# Patient Record
Sex: Male | Born: 1960 | Race: White | Hispanic: No | Marital: Married | Smoking: Former smoker
Health system: Southern US, Community
[De-identification: ages and names within clinical notes are randomized; demographics above are authoritative.]

## PROBLEM LIST (undated history)

## (undated) DIAGNOSIS — I10 Essential (primary) hypertension: Secondary | ICD-10-CM

## (undated) DIAGNOSIS — K219 Gastro-esophageal reflux disease without esophagitis: Secondary | ICD-10-CM

---

## 2008-06-05 ENCOUNTER — Ambulatory Visit: Payer: Self-pay | Admitting: Internal Medicine

## 2008-09-05 ENCOUNTER — Emergency Department: Payer: Self-pay | Admitting: Internal Medicine

## 2008-09-06 ENCOUNTER — Emergency Department: Payer: Self-pay | Admitting: Emergency Medicine

## 2008-09-07 ENCOUNTER — Emergency Department: Payer: Self-pay | Admitting: Emergency Medicine

## 2009-04-29 ENCOUNTER — Ambulatory Visit: Payer: Self-pay | Admitting: Internal Medicine

## 2009-05-01 ENCOUNTER — Ambulatory Visit: Payer: Self-pay | Admitting: Internal Medicine

## 2009-05-03 ENCOUNTER — Ambulatory Visit: Payer: Self-pay | Admitting: Internal Medicine

## 2009-05-06 ENCOUNTER — Ambulatory Visit: Payer: Self-pay | Admitting: Internal Medicine

## 2009-07-15 ENCOUNTER — Ambulatory Visit: Payer: Self-pay | Admitting: Family Medicine

## 2009-07-16 ENCOUNTER — Ambulatory Visit: Payer: Self-pay | Admitting: Internal Medicine

## 2009-07-18 ENCOUNTER — Ambulatory Visit: Payer: Self-pay | Admitting: Internal Medicine

## 2009-07-20 ENCOUNTER — Ambulatory Visit: Payer: Self-pay | Admitting: Internal Medicine

## 2009-07-22 ENCOUNTER — Ambulatory Visit: Payer: Self-pay | Admitting: Internal Medicine

## 2009-07-24 ENCOUNTER — Ambulatory Visit: Payer: Self-pay | Admitting: Family Medicine

## 2009-07-26 ENCOUNTER — Ambulatory Visit: Payer: Self-pay | Admitting: Internal Medicine

## 2009-09-25 ENCOUNTER — Ambulatory Visit: Payer: Self-pay | Admitting: Family Medicine

## 2009-10-22 ENCOUNTER — Ambulatory Visit: Payer: Self-pay | Admitting: Internal Medicine

## 2009-10-25 ENCOUNTER — Ambulatory Visit: Payer: Self-pay | Admitting: Family Medicine

## 2010-10-09 DIAGNOSIS — F1010 Alcohol abuse, uncomplicated: Secondary | ICD-10-CM | POA: Insufficient documentation

## 2010-10-09 DIAGNOSIS — Q631 Lobulated, fused and horseshoe kidney: Secondary | ICD-10-CM | POA: Insufficient documentation

## 2010-10-09 DIAGNOSIS — Z87891 Personal history of nicotine dependence: Secondary | ICD-10-CM | POA: Insufficient documentation

## 2012-05-19 ENCOUNTER — Encounter (HOSPITAL_COMMUNITY): Payer: Self-pay | Admitting: Emergency Medicine

## 2012-05-19 ENCOUNTER — Emergency Department (HOSPITAL_COMMUNITY)
Admission: EM | Admit: 2012-05-19 | Discharge: 2012-05-19 | Disposition: A | Discharge status: Home / Self Care | Payer: Self-pay | Attending: Emergency Medicine | Admitting: Emergency Medicine

## 2012-05-19 DIAGNOSIS — W57XXXA Bitten or stung by nonvenomous insect and other nonvenomous arthropods, initial encounter: Secondary | ICD-10-CM | POA: Insufficient documentation

## 2012-05-19 DIAGNOSIS — Y939 Activity, unspecified: Secondary | ICD-10-CM | POA: Insufficient documentation

## 2012-05-19 DIAGNOSIS — S30860A Insect bite (nonvenomous) of lower back and pelvis, initial encounter: Secondary | ICD-10-CM | POA: Insufficient documentation

## 2012-05-19 DIAGNOSIS — R21 Rash and other nonspecific skin eruption: Secondary | ICD-10-CM | POA: Insufficient documentation

## 2012-05-19 DIAGNOSIS — Y929 Unspecified place or not applicable: Secondary | ICD-10-CM | POA: Insufficient documentation

## 2012-05-19 MED ORDER — SULFAMETHOXAZOLE-TRIMETHOPRIM 800-160 MG PO TABS: 1.0000 | ORAL_TABLET | Freq: Two times a day (BID) | ORAL | Status: DC

## 2012-05-19 MED ORDER — PREDNISONE 10 MG PO TABS: ORAL_TABLET | ORAL | Status: DC

## 2012-05-19 NOTE — ED Notes (Signed)
Pt c/o rash to the left side around the breast area x one week.

## 2012-05-19 NOTE — ED Provider Notes (Signed)
History     CSN: [id number]  Arrival date & time 05/19/12  3086   First MD Initiated Contact with Patient 05/19/12 1909      Chief Complaint  Patient presents with  . Rash  . Pruritis    (Consider location/radiation/quality/duration/timing/severity/associated sxs/prior treatment) Patient is a 52 y.o. male presenting with rash. The history is provided by the patient.  Rash Location:  Torso Torso rash location:  L chest and L flank Quality: itchiness, redness and swelling   Quality: not blistering, not bruising, not burning, not draining, not dry, not painful, not scaling and not weeping   Severity:  Mild Onset quality:  Gradual Duration:  1 week Timing:  Constant Progression:  Unchanged Chronicity:  New Context: insect bite/sting   Context: not chemical exposure, not new detergent/soap, not plant contact, not sick contacts and not sun exposure   Relieved by:  Nothing Worsened by:  Heat Ineffective treatments:  Anti-itch cream Associated symptoms: no abdominal pain, no diarrhea, no fever, no headaches, no induration, no joint pain, no myalgias, no nausea, no shortness of breath, no sore throat, no throat swelling, no tongue swelling, no URI, not vomiting and not wheezing     History reviewed. No pertinent past medical history.  History reviewed. No pertinent past surgical history.  History reviewed. No pertinent family history.  History  Substance Use Topics  . Smoking status: Never Smoker   . Smokeless tobacco: Not on file  . Alcohol Use: Yes      Review of Systems  Constitutional: Negative for fever, chills, activity change and appetite change.  HENT: Negative for sore throat, facial swelling, trouble swallowing, neck pain and neck stiffness.   Respiratory: Negative for chest tightness, shortness of breath and wheezing.   Gastrointestinal: Negative for nausea, vomiting, abdominal pain and diarrhea.  Musculoskeletal: Negative for myalgias and arthralgias.   Skin: Positive for rash. Negative for wound.  Neurological: Negative for dizziness, weakness, numbness and headaches.  All other systems reviewed and are negative.    Allergies  Review of patient's allergies indicates not on file.  Home Medications  No current outpatient prescriptions on file.  BP 150/69  Pulse 73  Temp(Src) 97.8 F (36.6 C) (Oral)  Resp 20  Ht 6\' 2"  (1.88 m)  Wt 190 lb (86.183 kg)  BMI 24.38 kg/m2  SpO2 100%  Physical Exam  Nursing note and vitals reviewed. Constitutional: He is oriented to person, place, and time. He appears well-developed and well-nourished. No distress.  HENT:  Head: Normocephalic and atraumatic.  Mouth/Throat: Oropharynx is clear and moist.  Neck: Normal range of motion. Neck supple.  Cardiovascular: Normal rate, regular rhythm, normal heart sounds and intact distal pulses.   No murmur heard. Pulmonary/Chest: Effort normal and breath sounds normal.  Musculoskeletal: He exhibits no edema and no tenderness.  Lymphadenopathy:    He has no cervical adenopathy.  Neurological: He is alert and oriented to person, place, and time. He exhibits normal muscle tone. Coordination normal.  Skin: Skin is warm and dry. Rash noted. No purpura noted. Rash is papular and maculopapular. Rash is not pustular, not vesicular and not urticarial. There is erythema.  Erythematous slightly raised area to the left lateral chest with two adjacent smaller lesions that appear similar. No induration, fluctuance, or excessive warmth.  No vesicles.      ED Course  Procedures (including critical care time)  Labs Reviewed - No data to display No results found.      MDM  Patient  is alert, non-toxic appearing.  Vitals reviewed and considered.  4 cm erythematous slightly raised area to the left lateral chest with two adjacent, smaller lesions.  They appear c/w insect bites.  Will treat with prednisone taper and cover for potential secondary infection with  bactrim DS .  He also agrees to take OTC benadryl for itching   Pt agrees to return here in 1-2 days if the sx's worsen.  Appears stable for discharge.    Kameran Mcneese L. Linzie Criss, PA-C 05/20/12 0045

## 2012-05-21 NOTE — ED Provider Notes (Signed)
Medical screening examination/treatment/procedure(s) were performed by non-physician practitioner and as supervising physician I was immediately available for consultation/collaboration.   Benny Lennert, MD 05/21/12 [phone]

## 2012-10-07 ENCOUNTER — Emergency Department: Payer: Self-pay | Admitting: Internal Medicine

## 2013-02-04 ENCOUNTER — Ambulatory Visit: Payer: Self-pay | Admitting: Internal Medicine

## 2014-12-24 ENCOUNTER — Encounter: Payer: Self-pay | Admitting: Emergency Medicine

## 2014-12-24 ENCOUNTER — Ambulatory Visit
Admission: EM | Admit: 2014-12-24 | Discharge: 2014-12-24 | Disposition: A | Discharge status: Home / Self Care | Payer: Managed Care, Other (non HMO) | Attending: Family Medicine | Admitting: Family Medicine

## 2014-12-24 DIAGNOSIS — R509 Fever, unspecified: Secondary | ICD-10-CM

## 2014-12-24 DIAGNOSIS — M791 Myalgia, unspecified site: Secondary | ICD-10-CM

## 2014-12-24 DIAGNOSIS — R109 Unspecified abdominal pain: Secondary | ICD-10-CM | POA: Diagnosis not present

## 2014-12-24 HISTORY — DX: Essential (primary) hypertension: I10

## 2014-12-24 LAB — URINALYSIS COMPLETE WITH MICROSCOPIC (ARMC ONLY)
Bacteria, UA: NONE SEEN
Bilirubin Urine: NEGATIVE
Glucose, UA: NEGATIVE mg/dL
Leukocytes, UA: NEGATIVE
NITRITE: NEGATIVE
PH: 7 (ref 5.0–8.0)
PROTEIN: NEGATIVE mg/dL
SPECIFIC GRAVITY, URINE: 1.015 (ref 1.005–1.030)
WBC UA: NONE SEEN WBC/hpf (ref 0–5)

## 2014-12-24 LAB — CBC WITH DIFFERENTIAL/PLATELET
BASOS ABS: 0.1 10*3/uL (ref 0–0.1)
Basophils Relative: 1 %
EOS ABS: 0 10*3/uL (ref 0–0.7)
EOS PCT: 0 %
HCT: 44.7 % (ref 40.0–52.0)
Hemoglobin: 15.8 g/dL (ref 13.0–18.0)
Lymphocytes Relative: 15 %
Lymphs Abs: 0.7 10*3/uL — ABNORMAL LOW (ref 1.0–3.6)
MCH: 33.3 pg (ref 26.0–34.0)
MCHC: 35.4 g/dL (ref 32.0–36.0)
MCV: 93.9 fL (ref 80.0–100.0)
MONO ABS: 0.5 10*3/uL (ref 0.2–1.0)
Monocytes Relative: 11 %
Neutro Abs: 3.7 10*3/uL (ref 1.4–6.5)
Neutrophils Relative %: 73 %
PLATELETS: 213 10*3/uL (ref 150–440)
RBC: 4.76 MIL/uL (ref 4.40–5.90)
RDW: 12.6 % (ref 11.5–14.5)
WBC: 5.1 10*3/uL (ref 3.8–10.6)

## 2014-12-24 LAB — BASIC METABOLIC PANEL
ANION GAP: 11 (ref 5–15)
BUN: 13 mg/dL (ref 6–20)
CALCIUM: 9.3 mg/dL (ref 8.9–10.3)
CO2: 25 mmol/L (ref 22–32)
Chloride: 98 mmol/L — ABNORMAL LOW (ref 101–111)
Creatinine, Ser: 1.02 mg/dL (ref 0.61–1.24)
GFR calc Af Amer: >60 mL/min (ref >60)
Glucose, Bld: 111 mg/dL — ABNORMAL HIGH (ref 65–99)
POTASSIUM: 4 mmol/L (ref 3.5–5.1)
SODIUM: 134 mmol/L — AB (ref 135–145)

## 2014-12-24 LAB — RAPID INFLUENZA A&B ANTIGENS (ARMC ONLY): INFLUENZA A (ARMC): NOT DETECTED

## 2014-12-24 LAB — RAPID INFLUENZA A&B ANTIGENS: Influenza B (ARMC): NOT DETECTED

## 2014-12-24 MED ORDER — CIPROFLOXACIN HCL 500 MG PO TABS: 500.0000 mg | ORAL_TABLET | Freq: Two times a day (BID) | ORAL | Status: DC

## 2014-12-24 NOTE — ED Notes (Signed)
Pt reports bilat lower back pain, low grade fever and chills. Denies blood in urine. Cough.

## 2014-12-24 NOTE — ED Provider Notes (Addendum)
Patient presents today with symptoms of low-grade fever, myalgias, bilateral flank pain for the last day. He denies any upper respiratory symptoms. He denies any abdominal pain, nausea, vomiting, diarrhea, chest pain, shortness of breath. He denies having a cough to me even though stated to the nurse. He has had a right upper molar that has been bothering him but is not bothering him today. He is seeing his dentist tomorrow. He does admit to having multiple tick bites in the past but currently is not dealing with any rash or joint pain and swelling. He denies having a diagnosis of Lyme's disease are recommended spotted fever. He denies any penile discharge or dysuria. He does have urinary frequency but states that that is normal for him. He often times has to hold his urine due to not being able to urinate when needed at work. Denies any history of prostatitis in the past.  ROS: Negative except mentioned above.  GENERAL: NAD HEENT: no pharyngeal erythema, no exudate, no erythema of TMs, no cervical LAD, no tooth abscess or facial swelling noted  RESP: CTA B, no wheezing, rhonci, or accessory muscle use  CARD: RRR ABD: +BS, NT/ND, no rebound or guarding, mild bilateral flank tenderness, mildly enlarged boggy prostate NEURO: CN II-XII grossly intact   A/P: Subjective Fever, Myalgias, Flank Discomfort- Flu screen is negative, CBC essentially normal, urine analysis and BMP were also done, urine sent for culture, will treat patient with Cipro for possible prostatitis, Tylenol/Motrin prn, I've asked the patient to seek medical attention if his symptoms change or worsen in any way. Lyme and RMSF titers were drawn. He states that he will be seeing his dentist tomorrow. Follow up with primary care physician this week if possible.   Jolene ProvostKirtida Aviv Rota, MD 12/24/14 1231  Jolene ProvostKirtida Tylar Amborn, MD 12/24/14 [phone]

## 2014-12-26 LAB — ROCKY MTN SPOTTED FVR ABS PNL(IGG+IGM)
RMSF IgG: POSITIVE — AB
RMSF IgM: 0.24 index (ref 0.00–0.89)

## 2014-12-26 LAB — URINE CULTURE: SPECIAL REQUESTS: NORMAL

## 2014-12-26 LAB — B. BURGDORFI ANTIBODIES: B burgdorferi Ab IgG+IgM: <0.91 {ISR} (ref 0.00–0.90)

## 2014-12-26 LAB — RMSF, IGG, IFA

## 2015-01-04 ENCOUNTER — Telehealth: Payer: Self-pay | Admitting: Emergency Medicine

## 2015-01-04 MED ORDER — DOXYCYCLINE HYCLATE 100 MG PO TABS: 100.0000 mg | ORAL_TABLET | Freq: Two times a day (BID) | ORAL | Status: DC

## 2015-01-04 NOTE — ED Notes (Signed)
Patient was notified that his RMSF test was Positive and that Dr. Judd Gaudieronty sent in a prescription for Doxycycline for him to take.  Patient was instructed to take the Doxycycline as directed and to follow-up with his PCP if symptoms do not improve or worsen.  Patient verbalized understanding.

## 2017-08-07 ENCOUNTER — Ambulatory Visit
Admission: EM | Admit: 2017-08-07 | Discharge: 2017-08-07 | Disposition: A | Discharge status: Home / Self Care | Payer: Managed Care, Other (non HMO) | Attending: Family Medicine | Admitting: Family Medicine

## 2017-08-07 ENCOUNTER — Encounter: Payer: Self-pay | Admitting: Emergency Medicine

## 2017-08-07 ENCOUNTER — Other Ambulatory Visit: Payer: Self-pay

## 2017-08-07 DIAGNOSIS — R42 Dizziness and giddiness: Secondary | ICD-10-CM

## 2017-08-07 DIAGNOSIS — R6883 Chills (without fever): Secondary | ICD-10-CM | POA: Diagnosis not present

## 2017-08-07 DIAGNOSIS — M791 Myalgia, unspecified site: Secondary | ICD-10-CM | POA: Diagnosis not present

## 2017-08-07 LAB — CBC WITH DIFFERENTIAL/PLATELET
BASOS PCT: 1 %
Basophils Absolute: 0.1 10*3/uL (ref 0–0.1)
Eosinophils Absolute: 0.3 10*3/uL (ref 0–0.7)
Eosinophils Relative: 5 %
HEMATOCRIT: 40.9 % (ref 40.0–52.0)
Hemoglobin: 14.2 g/dL (ref 13.0–18.0)
LYMPHS ABS: 1.3 10*3/uL (ref 1.0–3.6)
LYMPHS PCT: 27 %
MCH: 33.4 pg (ref 26.0–34.0)
MCHC: 34.7 g/dL (ref 32.0–36.0)
MCV: 96.2 fL (ref 80.0–100.0)
MONO ABS: 0.4 10*3/uL (ref 0.2–1.0)
MONOS PCT: 9 %
NEUTROS ABS: 2.9 10*3/uL (ref 1.4–6.5)
Neutrophils Relative %: 58 %
Platelets: 238 10*3/uL (ref 150–440)
RBC: 4.25 MIL/uL — ABNORMAL LOW (ref 4.40–5.90)
RDW: 12.6 % (ref 11.5–14.5)
WBC: 5 10*3/uL (ref 3.8–10.6)

## 2017-08-07 LAB — COMPREHENSIVE METABOLIC PANEL
ALBUMIN: 3.9 g/dL (ref 3.5–5.0)
ALK PHOS: 60 U/L (ref 38–126)
ALT: 20 U/L (ref 0–44)
ANION GAP: 10 (ref 5–15)
AST: 23 U/L (ref 15–41)
BILIRUBIN TOTAL: 0.5 mg/dL (ref 0.3–1.2)
BUN: 14 mg/dL (ref 6–20)
CALCIUM: 8.7 mg/dL — AB (ref 8.9–10.3)
CO2: 23 mmol/L (ref 22–32)
Chloride: 101 mmol/L (ref 98–111)
Creatinine, Ser: 0.87 mg/dL (ref 0.61–1.24)
GFR calc Af Amer: >60 mL/min (ref >60)
Glucose, Bld: 106 mg/dL — ABNORMAL HIGH (ref 70–99)
Potassium: 3.6 mmol/L (ref 3.5–5.1)
Sodium: 134 mmol/L — ABNORMAL LOW (ref 135–145)
Total Protein: 7.1 g/dL (ref 6.5–8.1)

## 2017-08-07 MED ORDER — MECLIZINE HCL 25 MG PO TABS: 25.0000 mg | ORAL_TABLET | Freq: Three times a day (TID) | ORAL | 0 refills | Status: AC | PRN

## 2017-08-07 NOTE — Discharge Instructions (Signed)
Use the medication as needed.  Rest.  Increase fluids.  No alcohol.   Take care  Dr. Adriana Simasook

## 2017-08-07 NOTE — ED Provider Notes (Signed)
[facility]    CSN: [id number] Arrival date & time: 08/07/17  1552  History   Chief Complaint Chief Complaint  Patient presents with  . Dizziness   HPI  57 year old male presents with dizziness.  Patient reports dizziness and lightheadedness since Wednesday.  Intermittent.  Severe at times.  Patient states that it started after he was drinking alcohol and was out in the hot sun.  Patient has difficulty describing his dizziness and lightheadedness.  He states that he has a sensation of motion.  He also states that he is "not as aware" when he is driving.  Patient reports body aches today.  He also reports chills.  No fever.  No respiratory symptoms.  Patient concerned about many potential culprits.  He mentioned tickborne illness as well as pneumonia.  Patient states that he is unsure of what the inciting factor is.  He insinuates that it may be the fact that he is been outside in the heat.  No known exacerbating or relieving factors.   PMH: HLD (hyperlipidemia)    Horseshoe kidney    GERD (gastroesophageal reflux disease)    Seasonal allergies    Alcohol abuse    Former smoker    Horseshoe kidney    Premature atrial contractions     Surgical Hx: Colonoscopy.  Home Medications    Prior to Admission medications   Medication Sig Start Date End Date Taking? Authorizing Provider  amlodipine-atorvastatin (CADUET) 10-10 MG tablet Take 1 tablet by mouth daily.   Yes [provider]  aspirin EC 81 MG tablet Take 81 mg by mouth daily.   Yes [provider]  hydrochlorothiazide (MICROZIDE) 12.5 MG capsule Take 12.5 mg by mouth daily.   Yes [provider]  omega-3 acid ethyl esters (LOVAZA) 1 g capsule Take by mouth 2 (two) times daily.   Yes [provider]  meclizine (ANTIVERT) 25 MG tablet Take 1 tablet (25 mg total) by mouth 3 (three) times daily as needed for dizziness. 08/07/17   Tommie Sams, DO   Family History Family  History  Problem Relation Age of Onset  . Hypertension Mother   . Diabetes Mother   . Hypertension Father    Social History Social History   Tobacco Use  . Smoking status: Former Games developer  . Smokeless tobacco: Never Used  Substance Use Topics  . Alcohol use: Yes  . Drug use: No    Allergies   Patient has no allergy information on record.  Review of Systems Review of Systems  Constitutional: Positive for chills. Negative for fever.  Musculoskeletal:       Body aches.  Skin: Negative for rash.  Neurological: Positive for dizziness and light-headedness.   Physical Exam Triage Vital Signs ED Triage Vitals  Enc Vitals Group     BP 08/07/17 1603 (!) 171/90     Pulse Rate 08/07/17 1603 65     Resp 08/07/17 1603 18     Temp 08/07/17 1603 97.6 F (36.4 C)     Temp Source 08/07/17 1603 Oral     SpO2 08/07/17 1603 100 %     Weight 08/07/17 1604 200 lb (90.7 kg)     Height 08/07/17 1604 6' (1.829 m)     Head Circumference --      Peak Flow --      Pain Score 08/07/17 1603 2     Pain Loc --      Pain Edu? --  Excl. in GC? --    Updated Vital Signs BP (!) 171/90 (BP Location: Left Arm)   Pulse 65   Temp 97.6 F (36.4 C) (Oral)   Resp 18   Ht 6' (1.829 m)   Wt 200 lb (90.7 kg)   SpO2 100%   BMI 27.12 kg/m   Physical Exam  Constitutional: He is oriented to person, place, and time. He appears well-developed. No distress.  HENT:  Head: Normocephalic and atraumatic.  Mouth/Throat: Oropharynx is clear and moist.  Eyes: Pupils are equal, round, and reactive to light. Conjunctivae are normal.  Cardiovascular: Normal rate and regular rhythm.  Pulmonary/Chest: Effort normal and breath sounds normal. He has no wheezes. He has no rales.  Abdominal: Soft. He exhibits no distension. There is no tenderness.  Neurological: He is alert and oriented to person, place, and time.  Psychiatric: He has a normal mood and affect. His behavior is normal.  Nursing note and vitals  reviewed.  UC Treatments / Results  Labs (all labs ordered are listed, but only abnormal results are displayed) Labs Reviewed  CBC WITH DIFFERENTIAL/PLATELET - Abnormal; Notable for the following components:      Result Value   RBC 4.25 (*)    All other components within normal limits  COMPREHENSIVE METABOLIC PANEL - Abnormal; Notable for the following components:   Sodium 134 (*)    Glucose, Bld 106 (*)    Calcium 8.7 (*)    All other components within normal limits    EKG None  Radiology No results found.  Procedures Procedures (including critical care time)  Medications Ordered in UC Medications - No data to display  Initial Impression / Assessment and Plan / UC Course  I have reviewed the triage vital signs and the nursing notes.  Pertinent labs & imaging results that were available during my care of the patient were reviewed by me and considered in my medical decision making (see chart for details).    57 year old male presents with dizziness and lightheadedness.  History is very vague.  He is well-appearing.  His exam is unrevealing.  Orthostatics negative.  Laboratory studies unremarkable.  Meclizine as needed.  Advised hydration.  Supportive care.  Final Clinical Impressions(s) / UC Diagnoses   Final diagnoses:  Dizziness     Discharge Instructions     Use the medication as needed.  Rest.  Increase fluids.  No alcohol.   Take care  Dr. Adriana Simasook     ED Prescriptions    Medication Sig Dispense Auth. Provider   meclizine (ANTIVERT) 25 MG tablet Take 1 tablet (25 mg total) by mouth 3 (three) times daily as needed for dizziness. 30 tablet Tommie Samsook, Malaka Ruffner G, DO     Controlled Substance Prescriptions [place] Controlled Substance Registry consulted? Not Applicable   Tommie SamsCook, Agastya Meister G, DO 08/07/17 1710

## 2017-08-07 NOTE — ED Triage Notes (Signed)
Patient states he has had off and on dizziness since Wednesday.  Has a sinus head ache and some nausea as well

## 2020-06-04 ENCOUNTER — Emergency Department: Payer: Managed Care, Other (non HMO)

## 2020-06-04 ENCOUNTER — Observation Stay
Admission: EM | Admit: 2020-06-04 | Discharge: 2020-06-05 | Disposition: A | Discharge status: Home / Self Care | Payer: Managed Care, Other (non HMO) | Attending: Obstetrics and Gynecology | Admitting: Obstetrics and Gynecology

## 2020-06-04 ENCOUNTER — Other Ambulatory Visit: Payer: Self-pay

## 2020-06-04 DIAGNOSIS — Z87891 Personal history of nicotine dependence: Secondary | ICD-10-CM | POA: Diagnosis not present

## 2020-06-04 DIAGNOSIS — I10 Essential (primary) hypertension: Secondary | ICD-10-CM | POA: Diagnosis not present

## 2020-06-04 DIAGNOSIS — Z20822 Contact with and (suspected) exposure to covid-19: Secondary | ICD-10-CM | POA: Insufficient documentation

## 2020-06-04 DIAGNOSIS — Z7982 Long term (current) use of aspirin: Secondary | ICD-10-CM | POA: Diagnosis not present

## 2020-06-04 DIAGNOSIS — I200 Unstable angina: Principal | ICD-10-CM | POA: Diagnosis present

## 2020-06-04 DIAGNOSIS — R079 Chest pain, unspecified: Secondary | ICD-10-CM | POA: Diagnosis present

## 2020-06-04 DIAGNOSIS — Z79899 Other long term (current) drug therapy: Secondary | ICD-10-CM | POA: Diagnosis not present

## 2020-06-04 HISTORY — DX: Gastro-esophageal reflux disease without esophagitis: K21.9

## 2020-06-04 LAB — CBC
HCT: 41.4 % (ref 39.0–52.0)
Hemoglobin: 14.9 g/dL (ref 13.0–17.0)
MCH: 33.5 pg (ref 26.0–34.0)
MCHC: 36 g/dL (ref 30.0–36.0)
MCV: 93 fL (ref 80.0–100.0)
Platelets: 254 10*3/uL (ref 150–400)
RBC: 4.45 MIL/uL (ref 4.22–5.81)
RDW: 12 % (ref 11.5–15.5)
WBC: 6.8 10*3/uL (ref 4.0–10.5)
nRBC: 0 % (ref 0.0–0.2)

## 2020-06-04 LAB — BASIC METABOLIC PANEL
Anion gap: 10 (ref 5–15)
BUN: 10 mg/dL (ref 6–20)
CO2: 25 mmol/L (ref 22–32)
Calcium: 9.3 mg/dL (ref 8.9–10.3)
Chloride: 100 mmol/L (ref 98–111)
Creatinine, Ser: 1.08 mg/dL (ref 0.61–1.24)
GFR, Estimated: >60 mL/min (ref >60)
Glucose, Bld: 130 mg/dL — ABNORMAL HIGH (ref 70–99)
Potassium: 3.8 mmol/L (ref 3.5–5.1)
Sodium: 135 mmol/L (ref 135–145)

## 2020-06-04 LAB — HIV ANTIBODY (ROUTINE TESTING W REFLEX): HIV Screen 4th Generation wRfx: NONREACTIVE

## 2020-06-04 LAB — PROTIME-INR
INR: 1 (ref 0.8–1.2)
Prothrombin Time: 13.5 seconds (ref 11.4–15.2)

## 2020-06-04 LAB — HEPARIN LEVEL (UNFRACTIONATED): Heparin Unfractionated: 0.34 IU/mL (ref 0.30–0.70)

## 2020-06-04 LAB — TROPONIN I (HIGH SENSITIVITY)
Troponin I (High Sensitivity): 7 ng/L (ref <18)
Troponin I (High Sensitivity): 7 ng/L (ref <18)

## 2020-06-04 MED ORDER — ASPIRIN EC 81 MG PO TBEC
81.0000 mg | DELAYED_RELEASE_TABLET | Freq: Every day | ORAL | Status: DC
  Administered 2020-06-05: 81 mg via ORAL
  Filled 2020-06-04: qty 1

## 2020-06-04 MED ORDER — AMLODIPINE-ATORVASTATIN 10-10 MG PO TABS: 1.0000 | ORAL_TABLET | Freq: Every day | ORAL | Status: DC

## 2020-06-04 MED ORDER — PANTOPRAZOLE SODIUM 20 MG PO TBEC
20.0000 mg | DELAYED_RELEASE_TABLET | Freq: Two times a day (BID) | ORAL | Status: DC
  Administered 2020-06-04 – 2020-06-05 (×2): 20 mg via ORAL
  Filled 2020-06-04 (×3): qty 1

## 2020-06-04 MED ORDER — ONDANSETRON HCL 4 MG/2ML IJ SOLN
4.0000 mg | Freq: Once | INTRAMUSCULAR | Status: AC
  Administered 2020-06-04: 4 mg via INTRAVENOUS
  Filled 2020-06-04: qty 2

## 2020-06-04 MED ORDER — HEPARIN BOLUS VIA INFUSION
4000.0000 [IU] | Freq: Once | INTRAVENOUS | Status: AC
  Administered 2020-06-04: 4000 [IU] via INTRAVENOUS
  Filled 2020-06-04: qty 4000

## 2020-06-04 MED ORDER — HYDROCHLOROTHIAZIDE 12.5 MG PO CAPS
12.5000 mg | ORAL_CAPSULE | Freq: Every day | ORAL | Status: DC
  Administered 2020-06-05: 12.5 mg via ORAL
  Filled 2020-06-04: qty 1

## 2020-06-04 MED ORDER — OMEGA-3-ACID ETHYL ESTERS 1 G PO CAPS
1.0000 g | ORAL_CAPSULE | Freq: Two times a day (BID) | ORAL | Status: DC
  Administered 2020-06-04 – 2020-06-05 (×2): 1 g via ORAL
  Filled 2020-06-04 (×3): qty 1

## 2020-06-04 MED ORDER — AMLODIPINE BESYLATE 10 MG PO TABS
10.0000 mg | ORAL_TABLET | Freq: Every day | ORAL | Status: DC
  Administered 2020-06-04: 10 mg via ORAL
  Filled 2020-06-04: qty 2

## 2020-06-04 MED ORDER — ACETAMINOPHEN 325 MG PO TABS
650.0000 mg | ORAL_TABLET | ORAL | Status: DC | PRN
  Administered 2020-06-04: 650 mg via ORAL
  Filled 2020-06-04: qty 2

## 2020-06-04 MED ORDER — HEPARIN (PORCINE) 25000 UT/250ML-% IV SOLN
1250.0000 [IU]/h | INTRAVENOUS | Status: DC
  Administered 2020-06-04 – 2020-06-05 (×2): 1250 [IU]/h via INTRAVENOUS
  Filled 2020-06-04 (×3): qty 250

## 2020-06-04 MED ORDER — NITROGLYCERIN 0.4 MG SL SUBL
0.4000 mg | SUBLINGUAL_TABLET | SUBLINGUAL | Status: DC | PRN
  Administered 2020-06-04: 0.4 mg via SUBLINGUAL
  Filled 2020-06-04: qty 1

## 2020-06-04 MED ORDER — FLUTICASONE PROPIONATE 50 MCG/ACT NA SUSP
1.0000 | Freq: Every day | NASAL | Status: DC
  Filled 2020-06-04: qty 16

## 2020-06-04 MED ORDER — MORPHINE SULFATE (PF) 4 MG/ML IV SOLN
4.0000 mg | INTRAVENOUS | Status: DC | PRN
Start: 2020-06-04 — End: 2020-06-06
  Administered 2020-06-04 – 2020-06-05 (×4): 4 mg via INTRAVENOUS
  Filled 2020-06-04 (×4): qty 1

## 2020-06-04 MED ORDER — METOPROLOL TARTRATE 25 MG PO TABS
25.0000 mg | ORAL_TABLET | Freq: Two times a day (BID) | ORAL | Status: DC
  Administered 2020-06-04 – 2020-06-05 (×3): 25 mg via ORAL
  Filled 2020-06-04 (×3): qty 1

## 2020-06-04 MED ORDER — ONDANSETRON HCL 4 MG/2ML IJ SOLN: 4.0000 mg | Freq: Four times a day (QID) | INTRAMUSCULAR | Status: DC | PRN

## 2020-06-04 MED ORDER — IOHEXOL 350 MG/ML SOLN
75.0000 mL | Freq: Once | INTRAVENOUS | Status: AC | PRN
  Administered 2020-06-04: 75 mL via INTRAVENOUS

## 2020-06-04 MED ORDER — ATORVASTATIN CALCIUM 10 MG PO TABS: 10.0000 mg | ORAL_TABLET | Freq: Every day | ORAL | Status: DC

## 2020-06-04 NOTE — ED Provider Notes (Signed)
[facility] Emergency Department Provider Note    Event Date/Time   First MD Initiated Contact with Patient 06/04/20 [phone]     (approximate)  I have reviewed the triage vital signs and the nursing notes.   HISTORY  Chief Complaint Chest Pain    HPI Ian Mcpherson is a 60 y.o. male   with below listed past medical history presents to the ER for evaluation of left-sided chest pain radiating through to his back.  States the pain is moderate to severe associate with some diaphoresis.  Never had pain like this before.  States that occurred while he was putting on clothes.  Denies any fevers.  Does have a history of hypertension and high cholesterol.  Does have family history of CAD.  Does have a remote history of smoking.  Denies any trauma.  No cough or congestion.     Past Medical History:  Diagnosis Date  . Hypertension    Family History  Problem Relation Age of Onset  . Hypertension Mother   . Diabetes Mother   . Hypertension Father    History reviewed. No pertinent surgical history. There are no problems to display for this patient.     Prior to Admission medications   Medication Sig Start Date End Date Taking? Authorizing Provider  amlodipine-atorvastatin (CADUET) 10-10 MG tablet Take 1 tablet by mouth daily.    [provider]  aspirin EC 81 MG tablet Take 81 mg by mouth daily.    [provider]  hydrochlorothiazide (MICROZIDE) 12.5 MG capsule Take 12.5 mg by mouth daily.    [provider]  meclizine (ANTIVERT) 25 MG tablet Take 1 tablet (25 mg total) by mouth 3 (three) times daily as needed for dizziness. 08/07/17   Tommie Sams, DO  omega-3 acid ethyl esters (LOVAZA) 1 g capsule Take by mouth 2 (two) times daily.    [provider]    Allergies Patient has no allergy information on record.    Social History Social History   Tobacco Use  . Smoking status: Former Games developer  . Smokeless tobacco:  Never Used  Vaping Use  . Vaping Use: Former  Substance Use Topics  . Alcohol use: Yes    Comment: beer everday 3-4 beers  . Drug use: No    Review of Systems Patient denies headaches, rhinorrhea, blurry vision, numbness, shortness of breath, chest pain, edema, cough, abdominal pain, nausea, vomiting, diarrhea, dysuria, fevers, rashes or hallucinations unless otherwise stated above in HPI. ____________________________________________   PHYSICAL EXAM:  VITAL SIGNS: Vitals:   06/04/20 0839  BP: (!) 172/92  Pulse: 71  Resp: 18  Temp: 98 F (36.7 C)  SpO2: 99%    Constitutional: Alert and oriented.  Eyes: Conjunctivae are normal.  Head: Atraumatic. Nose: No congestion/rhinnorhea. Mouth/Throat: Mucous membranes are moist.   Neck: No stridor. Painless ROM.  Cardiovascular: Normal rate, regular rhythm. Grossly normal heart sounds.  Good peripheral circulation. Respiratory: Normal respiratory effort.  No retractions. Lungs CTAB. Gastrointestinal: Soft and nontender. No distention. No abdominal bruits. No CVA tenderness. Genitourinary:  Musculoskeletal: No lower extremity tenderness nor edema.  No joint effusions. Neurologic:  Normal speech and language. No gross focal neurologic deficits are appreciated. No facial droop Skin:  Skin is warm, dry and intact. No rash noted. Psychiatric: Mood and affect are normal. Speech and behavior are normal.  ____________________________________________   LABS (all labs ordered are listed, but only abnormal results are displayed)  Results for orders  placed or performed during the hospital encounter of 06/04/20 (from the past 24 hour(s))  Basic metabolic panel     Status: Abnormal   Collection Time: 06/04/20  8:38 AM  Result Value Ref Range   Sodium 135 135 - 145 mmol/L   Potassium 3.8 3.5 - 5.1 mmol/L   Chloride 100 98 - 111 mmol/L   CO2 25 22 - 32 mmol/L   Glucose, Bld 130 (H) 70 - 99 mg/dL   BUN 10 6 - 20 mg/dL   Creatinine, Ser  5.62 0.61 - 1.24 mg/dL   Calcium 9.3 8.9 - 13.0 mg/dL   GFR, Estimated >86 >57 mL/min   Anion gap 10 5 - 15  CBC     Status: None   Collection Time: 06/04/20  8:38 AM  Result Value Ref Range   WBC 6.8 4.0 - 10.5 K/uL   RBC 4.45 4.22 - 5.81 MIL/uL   Hemoglobin 14.9 13.0 - 17.0 g/dL   HCT 84.6 96.2 - 95.2 %   MCV 93.0 80.0 - 100.0 fL   MCH 33.5 26.0 - 34.0 pg   MCHC 36.0 30.0 - 36.0 g/dL   RDW 84.1 32.4 - 40.1 %   Platelets 254 150 - 400 K/uL   nRBC 0.0 0.0 - 0.2 %  Troponin I (High Sensitivity)     Status: None   Collection Time: 06/04/20  8:38 AM  Result Value Ref Range   Troponin I (High Sensitivity) 7 <18 ng/L   ____________________________________________  EKG My review and personal interpretation at Time: 8:33   Indication: chest pain  Rate: 75  Rhythm: sinus Axis: normal Other: normal intervals, no stemi ____________________________________________  RADIOLOGY  I personally reviewed all radiographic images ordered to evaluate for the above acute complaints and reviewed radiology reports and findings.  These findings were personally discussed with the patient.  Please see medical record for radiology report.  ____________________________________________   PROCEDURES  Procedure(s) performed:  .Critical Care Performed by: Willy Eddy, MD Authorized by: Willy Eddy, MD   Critical care provider statement:    Critical care time (minutes):  10   Critical care time was exclusive of:  Separately billable procedures and treating other patients   Critical care was necessary to treat or prevent imminent or life-threatening deterioration of the following conditions:  Cardiac failure   Critical care was time spent personally by me on the following activities:  Development of treatment plan with patient or surrogate, discussions with consultants, evaluation of patient's response to treatment, examination of patient, obtaining history from patient or surrogate,  ordering and performing treatments and interventions, ordering and review of laboratory studies, ordering and review of radiographic studies, pulse oximetry, re-evaluation of patient's condition and review of old charts      Critical Care performed: no ____________________________________________   INITIAL IMPRESSION / ASSESSMENT AND PLAN / ED COURSE  Pertinent labs & imaging results that were available during my care of the patient were reviewed by me and considered in my medical decision making (see chart for details).   DDX: ACS, pericarditis, esophagitis, boerhaaves, pe, dissection, pna, bronchitis, costochondritis   Esaw S Busche is a 60 y.o. who presents to the ED with presentation as described above.  Afebrile mildly hypertensive but nondistressed.  Does have risk factors for ACS given his pain radiating through to his back possible dissection therefore will order CTA.  EKG with no ST changes but does have hyperacute appearing T waves, no previous comparison.  Will order serial enzymes.  Initial was negative.  The patient will be placed on continuous pulse oximetry and telemetry for monitoring.  Laboratory evaluation will be sent to evaluate for the above complaints.     Clinical Course as of 06/04/20 1447  Mon Jun 04, 2020  1123 Discussed CT results with patient.  Given his age risk factors and persistent discomfort with some improvement in symptoms after nitro and morphine discussed case in consultation with cardiology agrees with plan for heparin and observation the hospital.  Patient agreeable to plan.  We discussed case with hospitalist.  [PR]    Clinical Course User Index [PR] Willy Eddy, MD    The patient was evaluated in Emergency Department today for the symptoms described in the history of present illness. He/she was evaluated in the context of the global COVID-19 pandemic, which necessitated consideration that the patient might be at risk for infection with the  SARS-CoV-2 virus that causes COVID-19. Institutional protocols and algorithms that pertain to the evaluation of patients at risk for COVID-19 are in a state of rapid change based on information released by regulatory bodies including the CDC and federal and state organizations. These policies and algorithms were followed during the patient's care in the ED.  As part of my medical decision making, I reviewed the following data within the electronic MEDICAL RECORD NUMBER Nursing notes reviewed and incorporated, Labs reviewed, notes from prior ED visits and [place] Controlled Substance Database   ____________________________________________   FINAL CLINICAL IMPRESSION(S) / ED DIAGNOSES  Final diagnoses:  Unstable angina (HCC)      NEW MEDICATIONS STARTED DURING THIS VISIT:  New Prescriptions   No medications on file     Note:  This document was prepared using Dragon voice recognition software and may include unintentional dictation errors.    Willy Eddy, MD 06/04/20 [phone]

## 2020-06-04 NOTE — ED Triage Notes (Signed)
Pt comes with c/o left sided CP that radiates to back the patient states this started this am. Pt states some SOB, and nausea.  Pt states he took 2 tylenol and 4 baby aspirin.

## 2020-06-04 NOTE — Consult Note (Signed)
ANTICOAGULATION CONSULT NOTE - Consult  Pharmacy Consult for Heparin gtt Indication: chest pain/ACS  Allergies  Allergen Reactions  . Statins     Other reaction(s): Muscle Pain Other reaction(s): Muscle Pain   . Sulfa Antibiotics Rash    Other reaction(s): Muscle Pain Other reaction(s): Muscle Pain  . Amoxicillin   . Losartan Palpitations  . Simvastatin Other (See Comments) and Rash    Other reaction(s): Muscle Pain    Patient Measurements: Height: 6' (182.9 cm) Weight: 97.3 kg (214 lb 8.1 oz) IBW/kg (Calculated) : 77.6 Heparin Dosing Weight: 97 kg  Vital Signs: Temp: 98 F (36.7 C) (05/02 0839) BP: 167/78 (05/02 1700) Pulse Rate: 77 (05/02 1700)  Labs: Recent Labs    06/04/20 0838 06/04/20 1032 06/04/20 1221 06/04/20 1835  HGB 14.9  --   --   --   HCT 41.4  --   --   --   PLT 254  --   --   --   LABPROT  --   --  13.5  --   INR  --   --  1.0  --   HEPARINUNFRC  --   --   --  0.34  CREATININE 1.08  --   --   --   TROPONINIHS 7 7  --   --     Estimated Creatinine Clearance: 89.1 mL/min (by C-G formula based on SCr of 1.08 mg/dL).   Medications:  PTA ASA; no AC on file. No relevant AC/APT allergies.  Heparin Dosing Weight: 97 kg  Assessment: 60 yo male with h/o HTN and HLD presenting w/ 1st occurrence of mod-sev Lt-sided CP and diaphoresis. Pharmacy consulted for the mgmt of heparin ISO ACS.  5/2 1835 HL=0.34 Therapuetic x 1 @ 1250 units/hr  Goal of Therapy:  Heparin level 0.3-0.7 units/ml Monitor platelets by anticoagulation protocol: Yes   Plan:  Heparin therapeutic Continue heparin infusion at 1250 units/hr Check confirmatory anti-Xa level in 6 hours and daily while on heparin Continue to monitor H&H and platelets daily with AM labs.  Sharen Hones, PharmD, BCPS Clinical Pharmacist  06/04/2020,7:32 PM

## 2020-06-04 NOTE — Progress Notes (Signed)
Contacted by RN Peter Congo with report on patient. Information and chart reviewed. Signed off to receive patient from ED. Sundra Aland made aware via secure chat.

## 2020-06-04 NOTE — H&P (Signed)
History and Physical    Ian Mcpherson:[id number] DOB: 1960/03/11 DOA: 06/04/2020  PCP: Ian Mcpherson   Patient coming from: Home  I have personally briefly reviewed patient's old medical records in [facility]  Chief Complaint: Chest pain/back pain  HPI: Ian Mcpherson is a 60 y.o. male with medical history significant for hypertension who presents to the emergency room for evaluation of chest pain mostly over the the left anterior chest wall with radiation to his mid back associated with shortness of breath, diaphoresis and nausea. Patient states that he was in his usual state of health and had gotten up to get ready for work.  He had just gotten out of the shower when he developed sharp pain over his left anterior chest wall which he rated an 8 x 10 in intensity at its worst.  Pain radiated to his left arm.  Pain improved following administration of morphine and nitroglycerin in the ER. Family history of coronary artery disease and a remote history of nicotine use. He denies having any abdominal pain, no fever, no chills, no cough, no dizziness, no lightheadedness, no palpitations,, no urinary symptoms or any changes in his bowel habits, no focal deficits, no blurred vision or headaches. Labs show sodium 135, potassium 3.8, chloride 100, bicarb 25, glucose 130, BUN 10, creatinine 1.08, calcium 9.3, troponin 7, white count 6.8, hemoglobin 14.9, hematocrit 41.4, MCV 93.0, RDW 12.0, platelet count 254, PT 13.5, INR 1.0 Chest x-ray shows no acute findings CT angiogram of the chest is positive for abundant soft mural plaque from the distal arch through the descending thoracic aorta. But negative for thoracic aortic aneurysm or dissection. No superimposed acute finding in the chest. Two tiny left lower lobe pulmonary nodules are probably postinflammatory. No follow-up needed if patient is low-risk (and has no known or suspected Mcpherson neoplasm). Non-contrast Chest CT can be  considered in 12 months if patient is high-risk. This recommendation follows the consensus statement: Guidelines for Management of Incidental Pulmonary Nodules Detected on CT Images: From the Fleischner Society 2017; Radiology 2017; 284:228-243. Twelve-lead EKG showed normal sinus rhythm  ED Course: Patient is a 60 year old Caucasian male who presents to the ER for evaluation of chest and back pain that started suddenly with radiation to the left arm and associated with nausea, diaphoresis and shortness of breath.  Patient ruled out for an acute coronary syndrome and has a normal twelve-lead EKG but continues to have chest pain at rest.  2 sets of cardiac enzymes are negative.  Patient had a CT angiogram to rule out aortic dissection and it showed abundant soft mural plaque from the distal arch through the descending thoracic aorta.  patient was started on a heparin drip and will be referred to observation status for further evaluation.    Review of Systems: As per HPI otherwise all other systems reviewed and negative.    Past Medical History:  Diagnosis Date  . Hypertension     History reviewed. No pertinent surgical history.   reports that he has quit smoking. He has never used smokeless tobacco. He reports current alcohol use. He reports that he does not use drugs.  Allergies  Allergen Reactions  . Statins     Other reaction(s): Muscle Pain Other reaction(s): Muscle Pain   . Sulfa Antibiotics Rash    Other reaction(s): Muscle Pain Other reaction(s): Muscle Pain  . Amoxicillin   . Losartan Palpitations  . Simvastatin Other (See Comments) and Rash  Other reaction(s): Muscle Pain    Family History  Problem Relation Age of Onset  . Hypertension Mother   . Diabetes Mother   . Hypertension Father       Prior to Admission medications   Medication Sig Start Date End Date Taking? Authorizing Provider  amlodipine-atorvastatin (CADUET) 10-10 MG tablet Take 1 tablet by mouth  daily.   Yes [provider]  aspirin EC 81 MG tablet Take 81 mg by mouth daily.   Yes [provider]  fluticasone (FLONASE) 50 MCG/ACT nasal spray Place 1 spray into both nostrils daily.   Yes [provider]  hydrochlorothiazide (MICROZIDE) 12.5 MG capsule Take 12.5 mg by mouth daily.   Yes [provider]  omega-3 acid ethyl esters (LOVAZA) 1 g capsule Take by mouth 2 (two) times daily.   Yes [provider]  pantoprazole (PROTONIX) 20 MG tablet Take 20 mg by mouth 2 (two) times daily. 05/31/20  Yes [provider]  meclizine (ANTIVERT) 25 MG tablet Take 1 tablet (25 mg total) by mouth 3 (three) times daily as needed for dizziness. Patient not taking: No sig reported 08/07/17   Tommie Sams, [place]    Physical Exam: Vitals:   06/04/20 1032 06/04/20 1100 06/04/20 1130 06/04/20 1139  BP: (!) 191/89 (!) 158/87 (!) 164/86   Pulse: 81  73   Resp: 10 11 11    Temp:      SpO2: 99%  99%   Weight:    97.3 kg  Height:         Vitals:   06/04/20 1032 06/04/20 1100 06/04/20 1130 06/04/20 1139  BP: (!) 191/89 (!) 158/87 (!) 164/86   Pulse: 81  73   Resp: 10 11 11    Temp:      SpO2: 99%  99%   Weight:    97.3 kg  Height:          Constitutional: Alert and oriented x 3 . Not in any apparent distress HEENT:      Head: Normocephalic and atraumatic.         Eyes: PERLA, EOMI, Conjunctivae are normal. Sclera is non-icteric.       Mouth/Throat: Mucous membranes are moist.       Neck: Supple with no signs of meningismus. Cardiovascular: Regular rate and rhythm. No murmurs, gallops, or rubs. 2+ symmetrical distal pulses are present . No JVD. No LE edema Respiratory: Respiratory effort normal .Lungs sounds clear bilaterally. No wheezes, crackles, or rhonchi.  Gastrointestinal: Soft, non tender, and non distended with positive bowel sounds.  Genitourinary: No CVA tenderness. Musculoskeletal: Nontender with normal range of motion in all  extremities. No cyanosis, or erythema of extremities. Neurologic:  Face is symmetric. Moving all extremities. No gross focal neurologic deficits  Skin: Skin is warm, dry.  No rash or ulcers Psychiatric: Mood and affect are normal   Labs on Admission: I have personally reviewed following labs and imaging studies  CBC: Recent Labs  Lab 06/04/20 0838  WBC 6.8  HGB 14.9  HCT 41.4  MCV 93.0  PLT 254   Basic Metabolic Panel: Recent Labs  Lab 06/04/20 0838  NA 135  K 3.8  CL 100  CO2 25  GLUCOSE 130*  BUN 10  CREATININE 1.08  CALCIUM 9.3   GFR: Estimated Creatinine Clearance: 89.1 mL/min (by C-G formula based on SCr of 1.08 mg/dL). Liver Function Tests: No results for input(s): AST, ALT, ALKPHOS, BILITOT, PROT, ALBUMIN in the last 168 hours.  No results for input(s): LIPASE, AMYLASE in the last 168 hours. No results for input(s): AMMONIA in the last 168 hours. Coagulation Profile: Recent Labs  Lab 06/04/20 1221  INR 1.0   Cardiac Enzymes: No results for input(s): CKTOTAL, CKMB, CKMBINDEX, TROPONINI in the last 168 hours. BNP (last 3 results) No results for input(s): PROBNP in the last 8760 hours. HbA1C: No results for input(s): HGBA1C in the last 72 hours. CBG: No results for input(s): GLUCAP in the last 168 hours. Lipid Profile: No results for input(s): CHOL, HDL, LDLCALC, TRIG, CHOLHDL, LDLDIRECT in the last 72 hours. Thyroid Function Tests: No results for input(s): TSH, T4TOTAL, FREET4, T3FREE, THYROIDAB in the last 72 hours. Anemia Panel: No results for input(s): VITAMINB12, FOLATE, FERRITIN, TIBC, IRON, RETICCTPCT in the last 72 hours. Urine analysis:    Component Value Date/Time   COLORURINE YELLOW 12/24/2014 1047   APPEARANCEUR CLEAR 12/24/2014 1047   LABSPEC 1.015 12/24/2014 1047   PHURINE 7.0 12/24/2014 1047   GLUCOSEU NEGATIVE 12/24/2014 1047   HGBUR TRACE (A) 12/24/2014 1047   BILIRUBINUR NEGATIVE 12/24/2014 1047   KETONESUR 1+ (A) 12/24/2014 1047    PROTEINUR NEGATIVE 12/24/2014 1047   NITRITE NEGATIVE 12/24/2014 1047   LEUKOCYTESUR NEGATIVE 12/24/2014 1047    Radiological Exams on Admission: DG Chest 2 View  Result Date: 06/04/2020 CLINICAL DATA:  Chest pain EXAM: CHEST - 2 VIEW COMPARISON:  None. FINDINGS: Mild apparent scarring medial left base. No edema or airspace opacity. Heart size and pulmonary vascularity are normal. No adenopathy. There are foci of degenerative change in the thoracic spine. IMPRESSION: Apparent scarring medial left base. Lungs otherwise clear. Heart size normal. No adenopathy. Electronically Signed   By: Bretta BangWilliam  Woodruff III M.D.   On: 06/04/2020 09:01   CT ANGIO CHEST AORTA W/CM & OR WO/CM  Result Date: 06/04/2020 CLINICAL DATA:  60 year old male with chest pain radiating to the back onset this morning. Shortness of breath and nausea. EXAM: CT ANGIOGRAPHY CHEST WITH CONTRAST TECHNIQUE: Multi detector noncontrast CT of the chest performed 1st, followed by Multidetector CT imaging of the chest was performed using the standard protocol during bolus administration of intravenous contrast. Multiplanar CT image reconstructions and MIPs were obtained to evaluate the vascular anatomy. CONTRAST:  75mL OMNIPAQUE IOHEXOL 350 MG/ML SOLN COMPARISON:  None. FINDINGS: Cardiovascular: Aortic contrast timing design for this exam and satisfactory. Negative for thoracic aortic dissection or aneurysm, but positive for fairly extensive predominantly soft plaque from the distal arch through the descending aorta and continuing into the upper abdomen. Three vessel arch configuration. Unremarkable proximal great vessels. Irregular mural plaque or thrombus in the descending aorta on series 5, image 71. No definite calcified coronary artery atherosclerosis. No cardiomegaly or pericardial effusion. Insufficient contrast in the main pulmonary arteries. Mediastinum/Nodes: Negative.  No lymphadenopathy. Lungs/Pleura: Major airways are patent. Tiny  subpleural 2-3 mm lung nodules in the left lower lobe on series 6, images 122 and 128. Subtle paraseptal emphysema in the lung apices. Mild scarring in the anterior left upper lobe. Minimal dependent atelectasis in the right lung. No pleural effusion or other pulmonary abnormality. Upper Abdomen: Negative visible liver, gallbladder, pancreas, spleen, adrenal glands, kidneys, and bowel in the upper abdomen. Musculoskeletal: Thoracic disc and endplate degeneration. No acute osseous abnormality identified. Review of the MIP images confirms the above findings. IMPRESSION: 1. Positive for abundant soft mural plaque from the distal arch through the descending thoracic aorta. But negative for thoracic aortic aneurysm or dissection. 2. No superimposed acute finding  in the chest. Two tiny left lower lobe pulmonary nodules are probably postinflammatory. No follow-up needed if patient is low-risk (and has no known or suspected Mcpherson neoplasm). Non-contrast Chest CT can be considered in 12 months if patient is high-risk. This recommendation follows the consensus statement: Guidelines for Management of Incidental Pulmonary Nodules Detected on CT Images: From the Fleischner Society 2017; Radiology 2017; 284:228-243. Aortic Atherosclerosis (ICD10-I70.0). Electronically Signed   By: Odessa Fleming M.D.   On: 06/04/2020 10:57     Assessment/Plan Principal Problem:   Unstable angina [facility]) Active Problems:   Essential hypertension    Unstable angina Patient presents for evaluation of chest/back pain with radiation to the left arm associated with diaphoresis, shortness of breath and nausea. He has a normal twelve-lead EKG 2 sets of cardiac enzymes are within normal limits but patient continues to have chest pain at rest He had a CT angiogram to rule out aortic dissection and it showed abundant soft mural plaque from the distal arch through the descending thoracic aorta. Continue heparin drip initiated in the ER Start  patient on low-dose beta-blocker Continue aspirin and statins Obtain 2D echocardiogram to assess LVEF and rule out regional wall motion abnormality Cardiology consult    Hypertension Continue amlodipine, HCTZ and low-dose beta-blocker  DVT prophylaxis: Heparin Code Status: full code Family Communication: Greater than 50% of time was spent discussing patient's condition and plan of care with him and his wife at the bedside.  All questions and concerns have been addressed.  They verbalized understanding and agree with the plan. Disposition Plan: Back to previous home environment Consults called: Cardiology Status: Observation    Allure Greaser MD [facility]     06/04/2020, 2:02 PM

## 2020-06-04 NOTE — ED Notes (Signed)
Patient back from CT.

## 2020-06-04 NOTE — Consult Note (Signed)
ANTICOAGULATION CONSULT NOTE - Consult  Pharmacy Consult for Heparin gtt Indication: chest pain/ACS  Not on File  Patient Measurements: Height: 6' (182.9 cm) Weight: 97.3 kg (214 lb 8.1 oz) IBW/kg (Calculated) : 77.6 Heparin Dosing Weight: 97 kg  Vital Signs: Temp: 98 F (36.7 C) (05/02 0839) BP: 164/86 (05/02 1130) Pulse Rate: 73 (05/02 1130)  Labs: Recent Labs    06/04/20 0838 06/04/20 1032  HGB 14.9  --   HCT 41.4  --   PLT 254  --   CREATININE 1.08  --   TROPONINIHS 7 7    Estimated Creatinine Clearance: 89.1 mL/min (by C-G formula based on SCr of 1.08 mg/dL).   Medications:  PTA ASA; no AC on file. No relevant AC/APT allergies.  Heparin Dosing Weight: 97 kg  Assessment: 60 yo male with h/o HTN and HLD presenting w/ 1st occurrence of mod-sev Lt-sided CP and diaphoresis. Pharmacy consulted for the mgmt of heparin ISO ACS.  Goal of Therapy:  Heparin level 0.3-0.7 units/ml Monitor platelets by anticoagulation protocol: Yes   Plan:  Give 4000 units bolus x 1 Start heparin infusion at 1250 units/hr Check anti-Xa level in 6 hours and daily while on heparin Continue to monitor H&H and platelets daily with AM labs.  Sherlynn Carbon Soleil Mas 06/04/2020,11:44 AM

## 2020-06-04 NOTE — ED Notes (Signed)
Informed  RN bed assigned 1730

## 2020-06-05 ENCOUNTER — Observation Stay (HOSPITAL_BASED_OUTPATIENT_CLINIC_OR_DEPARTMENT_OTHER): Payer: Managed Care, Other (non HMO)

## 2020-06-05 ENCOUNTER — Encounter: Payer: Self-pay | Admitting: Internal Medicine

## 2020-06-05 ENCOUNTER — Observation Stay (HOSPITAL_BASED_OUTPATIENT_CLINIC_OR_DEPARTMENT_OTHER)
Admit: 2020-06-05 | Discharge: 2020-06-05 | Disposition: A | Discharge status: Home / Self Care | Payer: Managed Care, Other (non HMO) | Attending: Internal Medicine | Admitting: Internal Medicine

## 2020-06-05 DIAGNOSIS — F1010 Alcohol abuse, uncomplicated: Secondary | ICD-10-CM

## 2020-06-05 DIAGNOSIS — R079 Chest pain, unspecified: Secondary | ICD-10-CM

## 2020-06-05 DIAGNOSIS — E785 Hyperlipidemia, unspecified: Secondary | ICD-10-CM

## 2020-06-05 DIAGNOSIS — I700 Atherosclerosis of aorta: Secondary | ICD-10-CM | POA: Diagnosis not present

## 2020-06-05 DIAGNOSIS — I340 Nonrheumatic mitral (valve) insufficiency: Secondary | ICD-10-CM

## 2020-06-05 DIAGNOSIS — I200 Unstable angina: Secondary | ICD-10-CM | POA: Diagnosis not present

## 2020-06-05 DIAGNOSIS — I10 Essential (primary) hypertension: Secondary | ICD-10-CM | POA: Diagnosis not present

## 2020-06-05 LAB — CBC
HCT: 43 % (ref 39.0–52.0)
Hemoglobin: 15.4 g/dL (ref 13.0–17.0)
MCH: 33.6 pg (ref 26.0–34.0)
MCHC: 35.8 g/dL (ref 30.0–36.0)
MCV: 93.9 fL (ref 80.0–100.0)
Platelets: 245 10*3/uL (ref 150–400)
RBC: 4.58 MIL/uL (ref 4.22–5.81)
RDW: 12.1 % (ref 11.5–15.5)
WBC: 7.4 10*3/uL (ref 4.0–10.5)
nRBC: 0 % (ref 0.0–0.2)

## 2020-06-05 LAB — SARS CORONAVIRUS 2 (TAT 6-24 HRS): SARS Coronavirus 2: NEGATIVE

## 2020-06-05 LAB — LIPID PANEL
Cholesterol: 249 mg/dL — ABNORMAL HIGH (ref 0–200)
HDL: 49 mg/dL (ref >40)
LDL Cholesterol: 179 mg/dL — ABNORMAL HIGH (ref 0–99)
Total CHOL/HDL Ratio: 5.1 RATIO
Triglycerides: 107 mg/dL (ref <150)
VLDL: 21 mg/dL (ref 0–40)

## 2020-06-05 LAB — ECHOCARDIOGRAM COMPLETE
AR max vel: 2.73 cm2
AV Area VTI: 2.41 cm2
AV Area mean vel: 2.63 cm2
AV Mean grad: 4 mmHg
AV Peak grad: 6.7 mmHg
Ao pk vel: 1.29 m/s
Area-P 1/2: 3.6 cm2
Height: 72 in
MV VTI: 2.63 cm2
S' Lateral: 3.1 cm
Weight: 3225.6 oz

## 2020-06-05 LAB — HEMOGLOBIN A1C
Hgb A1c MFr Bld: 5.5 % (ref 4.8–5.6)
Mean Plasma Glucose: 111.15 mg/dL

## 2020-06-05 LAB — NM MYOCAR MULTI W/SPECT W/WALL MOTION / EF
Estimated workload: 1 METS
Exercise duration (min): 0 min
Exercise duration (sec): 0 s
LV dias vol: 46 mL (ref 62–150)
LV sys vol: 16 mL
MPHR: 161 {beats}/min
Peak HR: 122 {beats}/min
Percent HR: 75 %
Rest HR: 80 {beats}/min
TID: 0.6

## 2020-06-05 LAB — HEPATITIS C ANTIBODY: HCV Ab: NONREACTIVE

## 2020-06-05 LAB — TSH: TSH: 3.751 u[IU]/mL (ref 0.350–4.500)

## 2020-06-05 LAB — HEPARIN LEVEL (UNFRACTIONATED): Heparin Unfractionated: 0.37 IU/mL (ref 0.30–0.70)

## 2020-06-05 MED ORDER — HYDRALAZINE HCL 20 MG/ML IJ SOLN
5.0000 mg | Freq: Every day | INTRAMUSCULAR | Status: DC | PRN
  Administered 2020-06-05: 5 mg via INTRAVENOUS
  Filled 2020-06-05: qty 1

## 2020-06-05 MED ORDER — AMLODIPINE BESYLATE 10 MG PO TABS
10.0000 mg | ORAL_TABLET | Freq: Every day | ORAL | Status: DC
  Administered 2020-06-05: 10 mg via ORAL
  Filled 2020-06-05: qty 1

## 2020-06-05 MED ORDER — TECHNETIUM TC 99M TETROFOSMIN IV KIT
10.0000 | PACK | Freq: Once | INTRAVENOUS | Status: AC | PRN
  Administered 2020-06-05: 10.07 via INTRAVENOUS

## 2020-06-05 MED ORDER — TIZANIDINE HCL 4 MG PO TABS
4.0000 mg | ORAL_TABLET | Freq: Once | ORAL | Status: AC
  Administered 2020-06-05: 4 mg via ORAL
  Filled 2020-06-05: qty 1

## 2020-06-05 MED ORDER — ATORVASTATIN CALCIUM 20 MG PO TABS
40.0000 mg | ORAL_TABLET | Freq: Every day | ORAL | Status: DC
  Administered 2020-06-05: 40 mg via ORAL
  Filled 2020-06-05: qty 2

## 2020-06-05 MED ORDER — REGADENOSON 0.4 MG/5ML IV SOLN
0.4000 mg | Freq: Once | INTRAVENOUS | Status: AC
  Administered 2020-06-05: 0.4 mg via INTRAVENOUS

## 2020-06-05 MED ORDER — TECHNETIUM TC 99M TETROFOSMIN IV KIT
29.2000 | PACK | Freq: Once | INTRAVENOUS | Status: AC | PRN
  Administered 2020-06-05: 29.2 via INTRAVENOUS

## 2020-06-05 NOTE — Plan of Care (Signed)
Communicated with Dr. Michaelle Birks, PA and confirmed since BP 145/73 and HR 60 - Asked to hold all meds prior to Stress test.  Will assess after test completed.

## 2020-06-05 NOTE — Consult Note (Signed)
ANTICOAGULATION CONSULT NOTE - Consult  Pharmacy Consult for Heparin gtt Indication: chest pain/ACS  Allergies  Allergen Reactions  . Statins     Other reaction(s): Muscle Pain Other reaction(s): Muscle Pain   . Sulfa Antibiotics Rash    Other reaction(s): Muscle Pain Other reaction(s): Muscle Pain  . Amoxicillin   . Losartan Palpitations  . Simvastatin Other (See Comments) and Rash    Other reaction(s): Muscle Pain    Patient Measurements: Height: 6' (182.9 cm) Weight: 91.4 kg (201 lb 9.6 oz) IBW/kg (Calculated) : 77.6 Heparin Dosing Weight: 97 kg  Vital Signs: Temp: 97.8 F (36.6 C) (05/02 1932) Temp Source: Oral (05/02 1932) BP: 172/97 (05/02 2105) Pulse Rate: 64 (05/02 2105)  Labs: Recent Labs    06/04/20 0838 06/04/20 1032 06/04/20 1221 06/04/20 1835 06/05/20 0020  HGB 14.9  --   --   --  15.4  HCT 41.4  --   --   --  43.0  PLT 254  --   --   --  245  LABPROT  --   --  13.5  --   --   INR  --   --  1.0  --   --   HEPARINUNFRC  --   --   --  0.34 0.37  CREATININE 1.08  --   --   --   --   TROPONINIHS 7 7  --   --   --     Estimated Creatinine Clearance: 80.8 mL/min (by C-G formula based on SCr of 1.08 mg/dL).   Medications:  PTA ASA; no AC on file. No relevant AC/APT allergies.  Heparin Dosing Weight: 97 kg  Assessment: 60 yo male with h/o HTN and HLD presenting w/ 1st occurrence of mod-sev Lt-sided CP and diaphoresis. Pharmacy consulted for the mgmt of heparin ISO ACS.  5/2 1835 HL=0.34  Therapuetic x 1 @ 1250 units/hr 5/2 0020 HL=0.37  Therapeutic x 2  Goal of Therapy:  Heparin level 0.3-0.7 units/ml Monitor platelets by anticoagulation protocol: Yes   Plan:  Heparin therapeutic Continue heparin infusion at 1250 units/hr Recheck HL daily with AM labs. Monitor H&H and platelets daily with AM labs.  Otelia Sergeant, PharmD, MBA 06/05/2020 1:08 AM

## 2020-06-05 NOTE — Progress Notes (Signed)
Chaplain completed Surveyor, mining. Emotional support.

## 2020-06-05 NOTE — Consult Note (Addendum)
Cardiology Consultation:   Patient ID: Ian FasterLamoine S Burdett MRN: [id number]; DOB: 11/04/1960  Admit date: 06/04/2020 Date of Consult: 06/05/2020  PCP:  [facility]   [facility] Providers Cardiologist:  None [facility] }    Patient Profile:   Ian Mcpherson is a 60 y.o. male with a hx of GERD, HTN, prior tobacco use, alcohol abuse, family h/o CAD who is being seen 06/05/2020 for the evaluation of chest pain at the request of Dr. Ashok PallWouk.  History of Present Illness:   Ian Mcpherson has no significant prior cardiac history. [facility] echo stress treadmill test for family h/o CAD. It showed normal exercise echo, normal diastolic dysfunction at both rest and after stress, nonspecific ST changes, exercise capacity was normal, overall it was a low risk study. He has family history  postiive for open heart surgery in both mom and dad in the 2560s, and uncles with heart disease. He quit smoking 2004. Drinks 5-6 beers daily. No drug use. Has not had anginal symptoms in the past. Follows with PCP regularly. He is taking medications for HTN And GERD. Reports intermittent swelling on his lower legs.    [facility] for chest pain. It began yesterday while getting dressed. IT is left sided and described as sharp with some pressure. Has some numbness down the left arm. HE had associated SOB, diaphoresis. Pain was constant. Was worse with exertion and movement. Possible that this is MSK since he was doing strenuous activity int he last week. Denies recent fever, chills, significant edema, orthopnea, pnd, palpitations.   In the ER BP was high 172/92, pulse 71, afebrile, RR 18, 99% O2.  He was given morphine and SL NTG. He is not sure if NTG helped since he was given morphine first. troponin negative x 2. Other labs showed sodium 135, potassium 3.8, bicarb 25, glucose 130, creatinine 1.08, BUN 10, WBC 6.8, Hgb 14.9, plt 254. CXR unremarkable. EKG showed NST  with TWI aVL and high T waves. CT chest showed plaque in the aorta, no aneurysm or dissection. He was started on IV heparin and admitted.   On my interview he says he is till having 1-2/10 chest pain, it has never gone away, has been constant.       Past Medical History:  Diagnosis Date  . Hypertension     History reviewed. No pertinent surgical history.   Home Medications:  Prior to Admission medications   Medication Sig Start Date End Date Taking? Authorizing Provider  amlodipine-atorvastatin (CADUET) 10-10 MG tablet Take 1 tablet by mouth daily.   Yes [provider]  aspirin EC 81 MG tablet Take 81 mg by mouth daily.   Yes [provider]  fluticasone (FLONASE) 50 MCG/ACT nasal spray Place 1 spray into both nostrils daily.   Yes [provider]  hydrochlorothiazide (MICROZIDE) 12.5 MG capsule Take 12.5 mg by mouth daily.   Yes [provider]  omega-3 acid ethyl esters (LOVAZA) 1 g capsule Take by mouth 2 (two) times daily.   Yes [provider]  pantoprazole (PROTONIX) 20 MG tablet Take 20 mg by mouth 2 (two) times daily. 05/31/20  Yes [provider]  meclizine (ANTIVERT) 25 MG tablet Take 1 tablet (25 mg total) by mouth 3 (three) times daily as needed for dizziness. Patient not taking: No sig reported 08/07/17   Tommie Samsook, Jayce G, DO    Inpatient Medications: Scheduled Meds: .  amLODipine  10 mg Oral Daily   And  . atorvastatin  10 mg Oral Daily  . aspirin EC  81 mg Oral Daily  . fluticasone  1 spray Each Nare Daily  . hydrochlorothiazide  12.5 mg Oral Daily  . metoprolol tartrate  25 mg Oral BID  . omega-3 acid ethyl esters  1 g Oral BID  . pantoprazole  20 mg Oral BID  . tiZANidine  4 mg Oral Once   Continuous Infusions: . heparin 1,250 Units/hr (06/05/20 0531)   PRN Meds: acetaminophen, morphine injection, nitroGLYCERIN, ondansetron (ZOFRAN) IV  Allergies:    Allergies  Allergen Reactions  . Statins     Other  reaction(s): Muscle Pain Other reaction(s): Muscle Pain   . Sulfa Antibiotics Rash    Other reaction(s): Muscle Pain Other reaction(s): Muscle Pain  . Amoxicillin   . Losartan Palpitations  . Simvastatin Other (See Comments) and Rash    Other reaction(s): Muscle Pain    Social History:   Social History   Socioeconomic History  . Marital status: Married    Spouse name: Not on file  . Number of children: Not on file  . Years of education: Not on file  . Highest education level: Not on file  Occupational History  . Not on file  Tobacco Use  . Smoking status: Former Games developer  . Smokeless tobacco: Never Used  Vaping Use  . Vaping Use: Former  Substance and Sexual Activity  . Alcohol use: Yes    Comment: beer everday 5-6 beers  . Drug use: No  . Sexual activity: Not on file  Other Topics Concern  . Not on file  Social History Narrative  . Not on file   Social Determinants of Health   Financial Resource Strain: Not on file  Food Insecurity: Not on file  Transportation Needs: Not on file  Physical Activity: Not on file  Stress: Not on file  Social Connections: Not on file  Intimate Partner Violence: Not on file    Family History:    Family History  Problem Relation Age of Onset  . Hypertension Mother   . Diabetes Mother   . Heart disease Mother 39  . Hypertension Father   . Heart disease Father 54     ROS:  Please see the history of present illness.   All other ROS reviewed and negative.     Physical Exam/Data:   Vitals:   06/04/20 1932 06/04/20 2105 06/05/20 0504 06/05/20 0722  BP: (!) 198/103 (!) 172/97 (!) 152/88 (!) 182/87  Pulse: 71 64 64 (!) 58  Resp: 18  18 18   Temp: 97.8 F (36.6 C)  98.1 F (36.7 C) 98.5 F (36.9 C)  TempSrc: Oral     SpO2: 100%  99% 100%  Weight:      Height:        Intake/Output Summary (Last 24 hours) at 06/05/2020 0923 Last data filed at 06/05/2020 0504 Gross per 24 hour  Intake 237.3 ml  Output --  Net 237.3 ml    Last 3 Weights 06/04/2020 06/04/2020 06/04/2020  Weight (lbs) 201 lb 9.6 oz 214 lb 8.1 oz 214 lb  Weight (kg) 91.445 kg 97.3 kg 97.07 kg     Body mass index is 27.34 kg/m.  General:  Well nourished, well developed, in no acute distress HEENT: normal Lymph: no adenopathy Neck: no JVD Endocrine:  No thryomegaly Vascular: No carotid bruits; FA pulses 2+ bilaterally without bruits  Cardiac:  normal S1,  S2; RRR; no murmur  Lungs:  clear to auscultation bilaterally, no wheezing, rhonchi or rales  Abd: soft, nontender, no hepatomegaly  Ext: no edema Musculoskeletal:  No deformities, BUE and BLE strength normal and equal Skin: warm and dry  Neuro:  CNs 2-12 intact, no focal abnormalities noted Psych:  Normal affect   EKG:  The EKG was personally reviewed and demonstrates:  NSR, TWI aVL, peaked T waves, no ST changes, 77 bpm Telemetry:  Telemetry was personally reviewed and demonstrates:  SB, HR 50s  Relevant CV Studies:  Echo ordered  Myoview ordered   Echo stress test treadmill 2019 This result has an attachment that is not available.   Normal exercise echocardiogram with normal diastolic function at both  rest and immediately after stress. No right ventricular systolic pressure  estimate is available   Minor non-specific ST and T wave changes noted during stress or recovery   Overall, the patient's exercise capacity was good.   Stress Findings  - The patient exercised for 6 minutes 0 seconds of a Bruce protocol reaching into stage 2 with a peak heart rate of 146 (89% of PMHR) achieving 7.0 METS with a Duke Treadmill score of 6.0.  - The stress was stopped due to target heart rate achieved.  - Overall, the patient's exercise capacity was good. Blood pressure demonstrated a hypertensive response to exercise. Heart rate demonstrated a normal response to exercise.  - The patient's response to exercise was adequate for diagnosis. - The patient experienced no angina during the  stress test.   Response to Stress  Normal ventricular cavity response immediately after stress. Symptoms are not consistent with ischemia. Low risk study. Resting diastolic function  E= 81.7 cm/s  E'= 8.5 cm/s  E/e'= 9.6  No TR velocity was available   Stress diastolic function  E= 69.9 cm/s  E'= 10.2 cm/s  E/e'= 6.9  No TR velocity was available   Baseline ECG  - Baseline ECG shows normal sinus rhythm and normal axis.   Stress ECG  - Minor non-specific ST and T wave changes noted during stress or recovery.  - No arrhythmia noted during stress.  - Arrhythmias noted during recovery: occasional PVCs.  - ECG was interpretable and conclusive .   Laboratory Data:  High Sensitivity Troponin:   Recent Labs  Lab 06/04/20 0838 06/04/20 1032  TROPONINIHS 7 7     Chemistry Recent Labs  Lab 06/04/20 0838  NA 135  K 3.8  CL 100  CO2 25  GLUCOSE 130*  BUN 10  CREATININE 1.08  CALCIUM 9.3  GFRNONAA >60  ANIONGAP 10    No results for input(s): PROT, ALBUMIN, AST, ALT, ALKPHOS, BILITOT in the last 168 hours. Hematology Recent Labs  Lab 06/04/20 0838 06/05/20 0020  WBC 6.8 7.4  RBC 4.45 4.58  HGB 14.9 15.4  HCT 41.4 43.0  MCV 93.0 93.9  MCH 33.5 33.6  MCHC 36.0 35.8  RDW 12.0 12.1  PLT 254 245   BNPNo results for input(s): BNP, PROBNP in the last 168 hours.  DDimer No results for input(s): DDIMER in the last 168 hours.   Radiology/Studies:  DG Chest 2 View  Result Date: 06/04/2020 CLINICAL DATA:  Chest pain EXAM: CHEST - 2 VIEW COMPARISON:  None. FINDINGS: Mild apparent scarring medial left base. No edema or airspace opacity. Heart size and pulmonary vascularity are normal. No adenopathy. There are foci of degenerative change in the thoracic spine. IMPRESSION: Apparent scarring medial left base. Lungs  otherwise clear. Heart size normal. No adenopathy. Electronically Signed   By: Bretta Bang III M.D.   On: 06/04/2020 09:01   CT ANGIO CHEST AORTA W/CM & OR  WO/CM  Result Date: 06/04/2020 CLINICAL DATA:  60 year old male with chest pain radiating to the back onset this morning. Shortness of breath and nausea. EXAM: CT ANGIOGRAPHY CHEST WITH CONTRAST TECHNIQUE: Multi detector noncontrast CT of the chest performed 1st, followed by Multidetector CT imaging of the chest was performed using the standard protocol during bolus administration of intravenous contrast. Multiplanar CT image reconstructions and MIPs were obtained to evaluate the vascular anatomy. CONTRAST:  72mL OMNIPAQUE IOHEXOL 350 MG/ML SOLN COMPARISON:  None. FINDINGS: Cardiovascular: Aortic contrast timing design for this exam and satisfactory. Negative for thoracic aortic dissection or aneurysm, but positive for fairly extensive predominantly soft plaque from the distal arch through the descending aorta and continuing into the upper abdomen. Three vessel arch configuration. Unremarkable proximal great vessels. Irregular mural plaque or thrombus in the descending aorta on series 5, image 71. No definite calcified coronary artery atherosclerosis. No cardiomegaly or pericardial effusion. Insufficient contrast in the main pulmonary arteries. Mediastinum/Nodes: Negative.  No lymphadenopathy. Lungs/Pleura: Major airways are patent. Tiny subpleural 2-3 mm lung nodules in the left lower lobe on series 6, images 122 and 128. Subtle paraseptal emphysema in the lung apices. Mild scarring in the anterior left upper lobe. Minimal dependent atelectasis in the right lung. No pleural effusion or other pulmonary abnormality. Upper Abdomen: Negative visible liver, gallbladder, pancreas, spleen, adrenal glands, kidneys, and bowel in the upper abdomen. Musculoskeletal: Thoracic disc and endplate degeneration. No acute osseous abnormality identified. Review of the MIP images confirms the above findings. IMPRESSION: 1. Positive for abundant soft mural plaque from the distal arch through the descending thoracic aorta. But negative  for thoracic aortic aneurysm or dissection. 2. No superimposed acute finding in the chest. Two tiny left lower lobe pulmonary nodules are probably postinflammatory. No follow-up needed if patient is low-risk (and has no known or suspected primary neoplasm). Non-contrast Chest CT can be considered in 12 months if patient is high-risk. This recommendation follows the consensus statement: Guidelines for Management of Incidental Pulmonary Nodules Detected on CT Images: From the Fleischner Society 2017; Radiology 2017; 284:228-243. Aortic Atherosclerosis (ICD10-I70.0). Electronically Signed   By: Odessa Fleming M.D.   On: 06/04/2020 10:57     Assessment and Plan:   Chest pain - presents with chest pain with typical and atypical symptoms - prior stress echo in 2019 that was normal - RF include HTN, family history, prior tobacco use. Also Chest CT showed plaque in the aorta - Reports he still has pain, 1-2/10. Unsure if Nitro helped because he had morphine before - HS trop negative x 2 - EKG unremarkable - check A1C and lipid panel - Echo ordered - continue statin, Aspirin.  - started on Lopressor 25mg  BID - tizanidine started in case this is MSK pain - Plan for Myoview lexiscan  HTN - significantly elevated on presentation - PTA amlodipine-atorvastatin 10-10mg  daily, HCTZ 12.5mg  daily>>continued on admission - Lopressor as above - pressures still significantly elevated, although has not had meds this AM - cannot increase BB with bradycardia - consider adding Ace/ARB  HLD -  PTA atorva 10 mg daily - LDL 179, I will increase atorvastatin to 4mg  daily  Alcohol use - drinks 5-6 beers daily, recommend decrease/cessation   For questions or updates, please contact [facility] Please consult [email or web address] for contact info under  Signed, Chrystian Ressler David Stall, PA-C  06/05/2020 9:23 AM

## 2020-06-05 NOTE — Discharge Summary (Addendum)
Ian Mcpherson ZOX:[id number] DOB: 05/03/1960 DOA: 06/04/2020  PCP: [facility]  Admit date: 06/04/2020 Discharge date: 06/05/2020  Time spent: 35 minutes  Recommendations for Outpatient Follow-up:  1. pcp f/u  2. F/u results of TTE (read pending at time of d/c)    Discharge Diagnoses:  Active Problems:   Essential hypertension   Chest pain   Discharge Condition: good  Diet recommendation: heart healthy  Filed Weights   06/04/20 0835 06/04/20 1139 06/04/20 1932  Weight: 97.1 kg 97.3 kg 91.4 kg    History of present illness:  Ian S Haugleis a 60 y.o.malewith medical history significant forhypertension who presents to the emergency room for evaluation of chest pain mostly over the the left anterior chest wall with radiation to his mid back associated with shortness of breath, diaphoresis and nausea. Patient states that he was in his usual state of health and had gotten up to get ready for work.He had just gotten out of the shower when he developed sharp pain over his left anterior chest wall which he rated an 8 x 10 in intensity at its worst. Pain radiated to his left arm. Pain improved following administration of morphine and nitroglycerin in the ER. Family history of coronary artery disease and a remote history of nicotine use. He denies having any abdominal pain, no fever, no chills, no cough, no dizziness, no lightheadedness, nopalpitations,, no urinary symptoms or any changes in his bowel habits, no focal deficits, no blurred vision or headaches.   Hospital Course:  Patient admitted for acute non-exertional chest pain. ekg non-ischemic, troponins neg, nuclear stress low-risk. Chest pain resolved at discharge. Does have coronary artery calcifications, advise continuing statin and aspirin.   Procedures:  none   Consultations:  cardiology  Discharge Exam: Vitals:   06/05/20 1334 06/05/20 1545  BP: 138/81 109/64  Pulse: 72 60  Resp: 18 18  Temp:  98.4 F (36.9 C) 98.3 F (36.8 C)  SpO2: 99% 98%    General exam: Appears calm and comfortable  Respiratory system: Clear to auscultation. Respiratory effort normal. Cardiovascular system: S1 & S2 heard, RRR. No JVD, murmurs, rubs, gallops or clicks. No pedal edema. No chest wall tenderness Gastrointestinal system: Abdomen is nondistended, soft and nontender. No organomegaly or masses felt. Normal bowel sounds heard. Central nervous system: Alert and oriented. No focal neurological deficits. Extremities: Symmetric 5 x 5 power. Skin: No rashes, lesions or ulcers Psychiatry: Judgement and insight appear normal. Mood & affect appropriate.   Discharge Instructions   Discharge Instructions    Diet - low sodium heart healthy   Complete by: As directed    Increase activity slowly   Complete by: As directed      Allergies as of 06/05/2020      Reactions   Statins    Other reaction(s): Muscle Pain Other reaction(s): Muscle Pain   Sulfa Antibiotics Rash   Other reaction(s): Muscle Pain Other reaction(s): Muscle Pain   Amoxicillin    Losartan Palpitations   Simvastatin Other (See Comments), Rash   Other reaction(s): Muscle Pain      Medication List    TAKE these medications   amlodipine-atorvastatin 10-10 MG tablet Commonly known as: CADUET Take 1 tablet by mouth daily.   aspirin EC 81 MG tablet Take 81 mg by mouth daily.   fluticasone 50 MCG/ACT nasal spray Commonly known as: FLONASE Place 1 spray into both nostrils daily.   hydrochlorothiazide 12.5 MG capsule Commonly known as: MICROZIDE Take 12.5 mg by  mouth daily.   meclizine 25 MG tablet Commonly known as: ANTIVERT Take 1 tablet (25 mg total) by mouth 3 (three) times daily as needed for dizziness.   omega-3 acid ethyl esters 1 g capsule Commonly known as: LOVAZA Take by mouth 2 (two) times daily.   pantoprazole 20 MG tablet Commonly known as: PROTONIX Take 20 mg by mouth 2 (two) times daily.       Allergies  Allergen Reactions  . Statins     Other reaction(s): Muscle Pain Other reaction(s): Muscle Pain   . Sulfa Antibiotics Rash    Other reaction(s): Muscle Pain Other reaction(s): Muscle Pain  . Amoxicillin   . Losartan Palpitations  . Simvastatin Other (See Comments) and Rash    Other reaction(s): Muscle Pain      The results of significant diagnostics from this hospitalization (including imaging, microbiology, ancillary and laboratory) are listed below for reference.    Significant Diagnostic Studies: DG Chest 2 View  Result Date: 06/04/2020 CLINICAL DATA:  Chest pain EXAM: CHEST - 2 VIEW COMPARISON:  None. FINDINGS: Mild apparent scarring medial left base. No edema or airspace opacity. Heart size and pulmonary vascularity are normal. No adenopathy. There are foci of degenerative change in the thoracic spine. IMPRESSION: Apparent scarring medial left base. Lungs otherwise clear. Heart size normal. No adenopathy. Electronically Signed   By: Bretta Bang III M.D.   On: 06/04/2020 09:01   NM Myocar Multi W/Spect W/Wall Motion / EF  Result Date: 06/05/2020  Normal pharmacologic myocardial perfusion stress test without significant ischemia or scar.  The left ventricular ejection fraction is normal (57%).  There is no significant coronary artery calcification. Aortic atherosclerosis is incidentally noted.  This is a low risk study.    CT ANGIO CHEST AORTA W/CM & OR WO/CM  Result Date: 06/04/2020 CLINICAL DATA:  60 year old male with chest pain radiating to the back onset this morning. Shortness of breath and nausea. EXAM: CT ANGIOGRAPHY CHEST WITH CONTRAST TECHNIQUE: Multi detector noncontrast CT of the chest performed 1st, followed by Multidetector CT imaging of the chest was performed using the standard protocol during bolus administration of intravenous contrast. Multiplanar CT image reconstructions and MIPs were obtained to evaluate the vascular anatomy. CONTRAST:  75mL  OMNIPAQUE IOHEXOL 350 MG/ML SOLN COMPARISON:  None. FINDINGS: Cardiovascular: Aortic contrast timing design for this exam and satisfactory. Negative for thoracic aortic dissection or aneurysm, but positive for fairly extensive predominantly soft plaque from the distal arch through the descending aorta and continuing into the upper abdomen. Three vessel arch configuration. Unremarkable proximal great vessels. Irregular mural plaque or thrombus in the descending aorta on series 5, image 71. No definite calcified coronary artery atherosclerosis. No cardiomegaly or pericardial effusion. Insufficient contrast in the main pulmonary arteries. Mediastinum/Nodes: Negative.  No lymphadenopathy. Lungs/Pleura: Major airways are patent. Tiny subpleural 2-3 mm lung nodules in the left lower lobe on series 6, images 122 and 128. Subtle paraseptal emphysema in the lung apices. Mild scarring in the anterior left upper lobe. Minimal dependent atelectasis in the right lung. No pleural effusion or other pulmonary abnormality. Upper Abdomen: Negative visible liver, gallbladder, pancreas, spleen, adrenal glands, kidneys, and bowel in the upper abdomen. Musculoskeletal: Thoracic disc and endplate degeneration. No acute osseous abnormality identified. Review of the MIP images confirms the above findings. IMPRESSION: 1. Positive for abundant soft mural plaque from the distal arch through the descending thoracic aorta. But negative for thoracic aortic aneurysm or dissection. 2. No superimposed acute finding in the  chest. Two tiny left lower lobe pulmonary nodules are probably postinflammatory. No follow-up needed if patient is low-risk (and has no known or suspected primary neoplasm). Non-contrast Chest CT can be considered in 12 months if patient is high-risk. This recommendation follows the consensus statement: Guidelines for Management of Incidental Pulmonary Nodules Detected on CT Images: From the Fleischner Society 2017; Radiology 2017;  284:228-243. Aortic Atherosclerosis (ICD10-I70.0). Electronically Signed   By: Odessa Fleming M.D.   On: 06/04/2020 10:57   ECHOCARDIOGRAM COMPLETE  Result Date: 06/05/2020    ECHOCARDIOGRAM REPORT   Patient Name:   HITOSHI WERTS Date of Exam: 06/05/2020 Medical Rec #:  [id number]        Height:       72.0 in Accession #:    [id number]       Weight:       201.6 lb Date of Birth:  1961/01/31         BSA:          2.138 m Patient Age:    60 years         BP:           182/87 mmHg Patient Gender: M                HR:           60 bpm. Exam Location:  [facility] Procedure: 2D Echo, Color Doppler and Cardiac Doppler Indications:     R07.9 Chest Pain  History:         Patient has no prior history of Echocardiogram examinations.                  Risk Factors:Hypertension.  Sonographer:     Humphrey Rolls RDCS (AE) Referring Phys:  [id number] Lucile Shutters Diagnosing Phys: Yvonne Kendall MD  Sonographer Comments: Suboptimal parasternal window. Global longitudinal strain was attempted. IMPRESSIONS  1. Left ventricular ejection fraction, by estimation, is 60 to 65%. The left ventricle has normal function. The left ventricle has no regional wall motion abnormalities. Left ventricular diastolic parameters were normal. The average left ventricular global longitudinal strain is -21.5 %. The global longitudinal strain is normal.  2. Right ventricular systolic function is normal. The right ventricular size is normal. Tricuspid regurgitation signal is inadequate for assessing PA pressure.  3. Left atrial size was mildly dilated.  4. Right atrial size was mildly dilated.  5. The mitral valve is grossly normal. Mild mitral valve regurgitation. No evidence of mitral stenosis.  6. The aortic valve has an indeterminant number of cusps. Aortic valve regurgitation is not visualized. No aortic stenosis is present. FINDINGS  Left Ventricle: Left ventricular ejection fraction, by estimation, is 60 to 65%. The left ventricle has normal function. The left  ventricle has no regional wall motion abnormalities. The average left ventricular global longitudinal strain is -21.5 %. The global longitudinal strain is normal. The left ventricular internal cavity size was normal in size. There is no left ventricular hypertrophy. Left ventricular diastolic parameters were normal. Right Ventricle: The right ventricular size is normal. No increase in right ventricular wall thickness. Right ventricular systolic function is normal. Tricuspid regurgitation signal is inadequate for assessing PA pressure. Left Atrium: Left atrial size was mildly dilated. Right Atrium: Right atrial size was mildly dilated. Pericardium: There is no evidence of pericardial effusion. Mitral Valve: The mitral valve is grossly normal. Mild mitral valve regurgitation. No evidence of mitral valve stenosis. MV peak gradient, 3.1 mmHg. The mean mitral valve  gradient is 1.0 mmHg. Tricuspid Valve: The tricuspid valve is normal in structure. Tricuspid valve regurgitation is trivial. Aortic Valve: The aortic valve has an indeterminant number of cusps. Aortic valve regurgitation is not visualized. No aortic stenosis is present. Aortic valve mean gradient measures 4.0 mmHg. Aortic valve peak gradient measures 6.7 mmHg. Aortic valve area, by VTI measures 2.41 cm. Pulmonic Valve: The pulmonic valve was not well visualized. Pulmonic valve regurgitation is not visualized. No evidence of pulmonic stenosis. Aorta: The aortic root is normal in size and structure. Pulmonary Artery: The pulmonary artery is not well seen. Venous: The inferior vena cava was not well visualized. IAS/Shunts: The interatrial septum was not well visualized.  LEFT VENTRICLE PLAX 2D LVIDd:         4.30 cm  Diastology LVIDs:         3.10 cm  LV e' medial:    8.05 cm/s LV PW:         1.00 cm  LV E/e' medial:  8.1 LV IVS:        0.90 cm  LV e' lateral:   11.60 cm/s LVOT diam:     2.00 cm  LV E/e' lateral: 5.7 LV SV:         71 LV SV Index:   33       2D  Longitudinal Strain LVOT Area:     3.14 cm 2D Strain GLS Avg:     -21.5 %  RIGHT VENTRICLE RV Basal diam:  2.80 cm LEFT ATRIUM           Index       RIGHT ATRIUM           Index LA diam:      3.60 cm 1.68 cm/m  RA Area:     20.10 cm LA Vol (A2C): 24.6 ml 11.51 ml/m RA Volume:   61.30 ml  28.67 ml/m LA Vol (A4C): 54.3 ml 25.40 ml/m  AORTIC VALVE                   PULMONIC VALVE AV Area (Vmax):    2.73 cm    PV Vmax:       0.98 m/s AV Area (Vmean):   2.63 cm    PV Vmean:      68.800 cm/s AV Area (VTI):     2.41 cm    PV VTI:        0.222 m AV Vmax:           129.00 cm/s PV Peak grad:  3.9 mmHg AV Vmean:          90.400 cm/s PV Mean grad:  2.0 mmHg AV VTI:            0.294 m AV Peak Grad:      6.7 mmHg AV Mean Grad:      4.0 mmHg LVOT Vmax:         112.00 cm/s LVOT Vmean:        75.700 cm/s LVOT VTI:          0.226 m LVOT/AV VTI ratio: 0.77  AORTA Ao Root diam: 3.00 cm MITRAL VALVE MV Area (PHT): 3.60 cm    SHUNTS MV Area VTI:   2.63 cm    Systemic VTI:  0.23 m MV Peak grad:  3.1 mmHg    Systemic Diam: 2.00 cm MV Mean grad:  1.0 mmHg MV Vmax:       0.88 m/s MV Vmean:  51.6 cm/s MV Decel Time: 211 msec MV E velocity: 65.60 cm/s MV A velocity: 63.00 cm/s MV E/A ratio:  1.04 Cristal Deer End MD Electronically signed by Yvonne Kendall MD Signature Date/Time: 06/05/2020/2:33:12 PM    Final     Microbiology: Recent Results (from the past 240 hour(s))  SARS CORONAVIRUS 2 (TAT 6-24 HRS) Nasopharyngeal Nasopharyngeal Swab     Status: None   Collection Time: 06/04/20  1:25 PM   Specimen: Nasopharyngeal Swab  Result Value Ref Range Status   SARS Coronavirus 2 NEGATIVE NEGATIVE Final    Comment: (NOTE) SARS-CoV-2 target nucleic acids are NOT DETECTED.  The SARS-CoV-2 RNA is generally detectable in upper and lower respiratory specimens during the acute phase of infection. Negative results do not preclude SARS-CoV-2 infection, do not rule out co-infections with other pathogens, and should not be used as  the sole basis for treatment or other patient management decisions. Negative results must be combined with clinical observations, patient history, and epidemiological information. The expected result is Negative.  Fact Sheet for Patients: HairSlick.no  Fact Sheet for Healthcare Providers: quierodirigir.com  This test is not yet approved or cleared by the [place] FDA and  has been authorized for detection and/or diagnosis of SARS-CoV-2 by FDA under an Emergency Use Authorization (EUA). This EUA will remain  in effect (meaning this test can be used) for the duration of the COVID-19 declaration under Se ction 564(b)(1) of the Act, 21 U.S.C. section 360bbb-3(b)(1), unless the authorization is terminated or revoked sooner.  Performed at [facility], [address], [place], [place] [address]      Labs: Basic Metabolic Panel: Recent Labs  Lab 06/04/20 0838  NA 135  K 3.8  CL 100  CO2 25  GLUCOSE 130*  BUN 10  CREATININE 1.08  CALCIUM 9.3   Liver Function Tests: No results for input(s): AST, ALT, ALKPHOS, BILITOT, PROT, ALBUMIN in the last 168 hours. No results for input(s): LIPASE, AMYLASE in the last 168 hours. No results for input(s): AMMONIA in the last 168 hours. CBC: Recent Labs  Lab 06/04/20 0838 06/05/20 0020  WBC 6.8 7.4  HGB 14.9 15.4  HCT 41.4 43.0  MCV 93.0 93.9  PLT 254 245   Cardiac Enzymes: No results for input(s): CKTOTAL, CKMB, CKMBINDEX, TROPONINI in the last 168 hours. BNP: BNP (last 3 results) No results for input(s): BNP in the last 8760 hours.  ProBNP (last 3 results) No results for input(s): PROBNP in the last 8760 hours.  CBG: No results for input(s): GLUCAP in the last 168 hours.     Signed:  Silvano Bilis MD.  [facility] 06/05/2020, 3:56 PM

## 2020-06-05 NOTE — Progress Notes (Signed)
*  PRELIMINARY RESULTS* Echocardiogram 2D Echocardiogram has been performed.  Ian Mcpherson 06/05/2020, 8:59 AM

## 2020-06-05 NOTE — Progress Notes (Signed)
Received notice that this pt is being called down for Lexiscan.  BP significantly elevated on review of vitals.  Please recheck this patient's blood pressure. If it is still elevated with SBP >160, please administer IV hydralazine (PRN order just now placed).

## 2020-06-05 NOTE — Progress Notes (Signed)
PROGRESS NOTE    Ian Mcpherson  JJH:[id number] DOB: 05/17/1960 DOA: 06/04/2020 PCP: [facility]  Outpatient Specialists: none    Brief Narrative:   Ian Mcpherson is a 60 y.o. male with medical history significant for hypertension who presents to the emergency room for evaluation of chest pain mostly over the the left anterior chest wall with radiation to his mid back associated with shortness of breath, diaphoresis and nausea. Patient states that he was in his usual state of health and had gotten up to get ready for work.  He had just gotten out of the shower when he developed sharp pain over his left anterior chest wall which he rated an 8 x 10 in intensity at its worst.  Pain radiated to his left arm.  Pain improved following administration of morphine and nitroglycerin in the ER. Family history of coronary artery disease and a remote history of nicotine use. He denies having any abdominal pain, no fever, no chills, no cough, no dizziness, no lightheadedness, no palpitations,, no urinary symptoms or any changes in his bowel habits, no focal deficits, no blurred vision or headaches.  Assessment & Plan:   Principal Problem:   Unstable angina [facility]) Active Problems:   Essential hypertension   # Chest pain Symptoms suggestive of possible unstable angina. CTA of chest is negative. Troponins neg and ekg non-ischemic. Pain is mainly reproduced by movement so msk etiology also quite possible. Has hx of rash with statin. - continue aspirin - continue heparin - TTE ordered, read pending - cardiology consulted, eval pending - f/u a1c, lipid panel - trial tizanidine - repeat ekg   # Htn Here bp wnl - cont home amlodipine and hctz - metoprolol added by admitting md  # HCM - f/u hcv  DVT prophylaxis: heparin therapeutic Code Status: full Family Communication: none @ bedside  Level of care: Progressive Cardiac Status is: Observation  The patient remains OBS  appropriate and will d/c before 2 midnights.  Dispo: The patient is from: Home              Anticipated d/c is to: Home              Patient currently is not medically stable to d/c.   Difficult to place patient No        Consultants:  cardiology  Procedures: none  Antimicrobials:  none    Subjective: This morning pain persists left side of chest radiating to the left. Worse with certain positions. No n/v/d.   Objective: Vitals:   06/04/20 1932 06/04/20 2105 06/05/20 0504 06/05/20 0722  BP: (!) 198/103 (!) 172/97 (!) 152/88 (!) 182/87  Pulse: 71 64 64 (!) 58  Resp: 18  18 18   Temp: 97.8 F (36.6 C)  98.1 F (36.7 C) 98.5 F (36.9 C)  TempSrc: Oral     SpO2: 100%  99% 100%  Weight:      Height:        Intake/Output Summary (Last 24 hours) at 06/05/2020 0738 Last data filed at 06/05/2020 0504 Gross per 24 hour  Intake 237.3 ml  Output --  Net 237.3 ml   Filed Weights   06/04/20 0835 06/04/20 1139 06/04/20 1932  Weight: 97.1 kg 97.3 kg 91.4 kg    Examination:  General exam: Appears calm and comfortable  Respiratory system: Clear to auscultation. Respiratory effort normal. Cardiovascular system: S1 & S2 heard, RRR. No JVD, murmurs, rubs, gallops or clicks. No pedal edema. No chest  wall tenderness Gastrointestinal system: Abdomen is nondistended, soft and nontender. No organomegaly or masses felt. Normal bowel sounds heard. Central nervous system: Alert and oriented. No focal neurological deficits. Extremities: Symmetric 5 x 5 power. Skin: No rashes, lesions or ulcers Psychiatry: Judgement and insight appear normal. Mood & affect appropriate.     Data Reviewed: I have personally reviewed following labs and imaging studies  CBC: Recent Labs  Lab 06/04/20 0838 06/05/20 0020  WBC 6.8 7.4  HGB 14.9 15.4  HCT 41.4 43.0  MCV 93.0 93.9  PLT 254 245   Basic Metabolic Panel: Recent Labs  Lab 06/04/20 0838  NA 135  K 3.8  CL 100  CO2 25  GLUCOSE  130*  BUN 10  CREATININE 1.08  CALCIUM 9.3   GFR: Estimated Creatinine Clearance: 80.8 mL/min (by C-G formula based on SCr of 1.08 mg/dL). Liver Function Tests: No results for input(s): AST, ALT, ALKPHOS, BILITOT, PROT, ALBUMIN in the last 168 hours. No results for input(s): LIPASE, AMYLASE in the last 168 hours. No results for input(s): AMMONIA in the last 168 hours. Coagulation Profile: Recent Labs  Lab 06/04/20 1221  INR 1.0   Cardiac Enzymes: No results for input(s): CKTOTAL, CKMB, CKMBINDEX, TROPONINI in the last 168 hours. BNP (last 3 results) No results for input(s): PROBNP in the last 8760 hours. HbA1C: No results for input(s): HGBA1C in the last 72 hours. CBG: No results for input(s): GLUCAP in the last 168 hours. Lipid Profile: No results for input(s): CHOL, HDL, LDLCALC, TRIG, CHOLHDL, LDLDIRECT in the last 72 hours. Thyroid Function Tests: No results for input(s): TSH, T4TOTAL, FREET4, T3FREE, THYROIDAB in the last 72 hours. Anemia Panel: No results for input(s): VITAMINB12, FOLATE, FERRITIN, TIBC, IRON, RETICCTPCT in the last 72 hours. Urine analysis:    Component Value Date/Time   COLORURINE YELLOW 12/24/2014 1047   APPEARANCEUR CLEAR 12/24/2014 1047   LABSPEC 1.015 12/24/2014 1047   PHURINE 7.0 12/24/2014 1047   GLUCOSEU NEGATIVE 12/24/2014 1047   HGBUR TRACE (A) 12/24/2014 1047   BILIRUBINUR NEGATIVE 12/24/2014 1047   KETONESUR 1+ (A) 12/24/2014 1047   PROTEINUR NEGATIVE 12/24/2014 1047   NITRITE NEGATIVE 12/24/2014 1047   LEUKOCYTESUR NEGATIVE 12/24/2014 1047   Sepsis Labs: @LABRCNTIP (procalcitonin:4,lacticidven:4)  ) Recent Results (from the past 240 hour(s))  SARS CORONAVIRUS 2 (TAT 6-24 HRS) Nasopharyngeal Nasopharyngeal Swab     Status: None   Collection Time: 06/04/20  1:25 PM   Specimen: Nasopharyngeal Swab  Result Value Ref Range Status   SARS Coronavirus 2 NEGATIVE NEGATIVE Final    Comment: (NOTE) SARS-CoV-2 target nucleic acids are  NOT DETECTED.  The SARS-CoV-2 RNA is generally detectable in upper and lower respiratory specimens during the acute phase of infection. Negative results do not preclude SARS-CoV-2 infection, do not rule out co-infections with other pathogens, and should not be used as the sole basis for treatment or other patient management decisions. Negative results must be combined with clinical observations, patient history, and epidemiological information. The expected result is Negative.  Fact Sheet for Patients: 08/04/20  Fact Sheet for Healthcare Providers: HairSlick.no  This test is not yet approved or cleared by the quierodirigir.com FDA and  has been authorized for detection and/or diagnosis of SARS-CoV-2 by FDA under an Emergency Use Authorization (EUA). This EUA will remain  in effect (meaning this test can be used) for the duration of the COVID-19 declaration under Se ction 564(b)(1) of the Act, 21 U.S.C. section 360bbb-3(b)(1), unless the authorization is terminated or  revoked sooner.  Performed at [facility], [address], [place], [place] [address]          Radiology Studies: DG Chest 2 View  Result Date: 06/04/2020 CLINICAL DATA:  Chest pain EXAM: CHEST - 2 VIEW COMPARISON:  None. FINDINGS: Mild apparent scarring medial left base. No edema or airspace opacity. Heart size and pulmonary vascularity are normal. No adenopathy. There are foci of degenerative change in the thoracic spine. IMPRESSION: Apparent scarring medial left base. Lungs otherwise clear. Heart size normal. No adenopathy. Electronically Signed   By: Bretta Bang III M.D.   On: 06/04/2020 09:01   CT ANGIO CHEST AORTA W/CM & OR WO/CM  Result Date: 06/04/2020 CLINICAL DATA:  60 year old male with chest pain radiating to the back onset this morning. Shortness of breath and nausea. EXAM: CT ANGIOGRAPHY CHEST WITH CONTRAST TECHNIQUE: Multi  detector noncontrast CT of the chest performed 1st, followed by Multidetector CT imaging of the chest was performed using the standard protocol during bolus administration of intravenous contrast. Multiplanar CT image reconstructions and MIPs were obtained to evaluate the vascular anatomy. CONTRAST:  55mL OMNIPAQUE IOHEXOL 350 MG/ML SOLN COMPARISON:  None. FINDINGS: Cardiovascular: Aortic contrast timing design for this exam and satisfactory. Negative for thoracic aortic dissection or aneurysm, but positive for fairly extensive predominantly soft plaque from the distal arch through the descending aorta and continuing into the upper abdomen. Three vessel arch configuration. Unremarkable proximal great vessels. Irregular mural plaque or thrombus in the descending aorta on series 5, image 71. No definite calcified coronary artery atherosclerosis. No cardiomegaly or pericardial effusion. Insufficient contrast in the main pulmonary arteries. Mediastinum/Nodes: Negative.  No lymphadenopathy. Lungs/Pleura: Major airways are patent. Tiny subpleural 2-3 mm lung nodules in the left lower lobe on series 6, images 122 and 128. Subtle paraseptal emphysema in the lung apices. Mild scarring in the anterior left upper lobe. Minimal dependent atelectasis in the right lung. No pleural effusion or other pulmonary abnormality. Upper Abdomen: Negative visible liver, gallbladder, pancreas, spleen, adrenal glands, kidneys, and bowel in the upper abdomen. Musculoskeletal: Thoracic disc and endplate degeneration. No acute osseous abnormality identified. Review of the MIP images confirms the above findings. IMPRESSION: 1. Positive for abundant soft mural plaque from the distal arch through the descending thoracic aorta. But negative for thoracic aortic aneurysm or dissection. 2. No superimposed acute finding in the chest. Two tiny left lower lobe pulmonary nodules are probably postinflammatory. No follow-up needed if patient is low-risk (and  has no known or suspected primary neoplasm). Non-contrast Chest CT can be considered in 12 months if patient is high-risk. This recommendation follows the consensus statement: Guidelines for Management of Incidental Pulmonary Nodules Detected on CT Images: From the Fleischner Society 2017; Radiology 2017; 284:228-243. Aortic Atherosclerosis (ICD10-I70.0). Electronically Signed   By: Odessa Fleming M.D.   On: 06/04/2020 10:57        Scheduled Meds: . amLODipine  10 mg Oral Daily   And  . atorvastatin  10 mg Oral Daily  . aspirin EC  81 mg Oral Daily  . fluticasone  1 spray Each Nare Daily  . hydrochlorothiazide  12.5 mg Oral Daily  . metoprolol tartrate  25 mg Oral BID  . omega-3 acid ethyl esters  1 g Oral BID  . pantoprazole  20 mg Oral BID  . tiZANidine  4 mg Oral Once   Continuous Infusions: . heparin 1,250 Units/hr (06/05/20 0531)     LOS: 0 days  Time spent: 30 min    Silvano BilisNoah B Darell Saputo, MD [facility]   If 7PM-7AM, please contact night-coverage [email or web address] Password [facility] 06/05/2020, 7:38 AM

## 2020-08-23 ENCOUNTER — Emergency Department
Admission: EM | Admit: 2020-08-23 | Discharge: 2020-08-23 | Disposition: A | Discharge status: Home / Self Care | Payer: Managed Care, Other (non HMO) | Attending: Emergency Medicine | Admitting: Emergency Medicine

## 2020-08-23 ENCOUNTER — Other Ambulatory Visit: Payer: Self-pay

## 2020-08-23 ENCOUNTER — Emergency Department: Payer: Managed Care, Other (non HMO)

## 2020-08-23 DIAGNOSIS — Z79899 Other long term (current) drug therapy: Secondary | ICD-10-CM | POA: Insufficient documentation

## 2020-08-23 DIAGNOSIS — Z7982 Long term (current) use of aspirin: Secondary | ICD-10-CM | POA: Insufficient documentation

## 2020-08-23 DIAGNOSIS — Z87891 Personal history of nicotine dependence: Secondary | ICD-10-CM | POA: Diagnosis not present

## 2020-08-23 DIAGNOSIS — I10 Essential (primary) hypertension: Secondary | ICD-10-CM | POA: Insufficient documentation

## 2020-08-23 DIAGNOSIS — U071 COVID-19: Secondary | ICD-10-CM

## 2020-08-23 DIAGNOSIS — E860 Dehydration: Secondary | ICD-10-CM

## 2020-08-23 DIAGNOSIS — R509 Fever, unspecified: Secondary | ICD-10-CM | POA: Diagnosis present

## 2020-08-23 DIAGNOSIS — R000 Tachycardia, unspecified: Secondary | ICD-10-CM | POA: Diagnosis not present

## 2020-08-23 DIAGNOSIS — M791 Myalgia, unspecified site: Secondary | ICD-10-CM

## 2020-08-23 LAB — URINALYSIS, COMPLETE (UACMP) WITH MICROSCOPIC
Bacteria, UA: NONE SEEN
Bilirubin Urine: NEGATIVE
Glucose, UA: NEGATIVE mg/dL
Ketones, ur: NEGATIVE mg/dL
Leukocytes,Ua: NEGATIVE
Nitrite: NEGATIVE
Protein, ur: 30 mg/dL — AB
Specific Gravity, Urine: 1.01 (ref 1.005–1.030)
Squamous Epithelial / HPF: NONE SEEN (ref 0–5)
pH: 9 — ABNORMAL HIGH (ref 5.0–8.0)

## 2020-08-23 LAB — CBC WITH DIFFERENTIAL/PLATELET
Abs Immature Granulocytes: 0.02 10*3/uL (ref 0.00–0.07)
Basophils Absolute: 0.1 10*3/uL (ref 0.0–0.1)
Basophils Relative: 1 %
Eosinophils Absolute: 0.1 10*3/uL (ref 0.0–0.5)
Eosinophils Relative: 2 %
HCT: 42.1 % (ref 39.0–52.0)
Hemoglobin: 15 g/dL (ref 13.0–17.0)
Immature Granulocytes: 0 %
Lymphocytes Relative: 7 %
Lymphs Abs: 0.3 10*3/uL — ABNORMAL LOW (ref 0.7–4.0)
MCH: 33.9 pg (ref 26.0–34.0)
MCHC: 35.6 g/dL (ref 30.0–36.0)
MCV: 95.2 fL (ref 80.0–100.0)
Monocytes Absolute: 0.5 10*3/uL (ref 0.1–1.0)
Monocytes Relative: 11 %
Neutro Abs: 3.8 10*3/uL (ref 1.7–7.7)
Neutrophils Relative %: 79 %
Platelets: 232 10*3/uL (ref 150–400)
RBC: 4.42 MIL/uL (ref 4.22–5.81)
RDW: 11.9 % (ref 11.5–15.5)
WBC: 4.7 10*3/uL (ref 4.0–10.5)
nRBC: 0 % (ref 0.0–0.2)

## 2020-08-23 LAB — COMPREHENSIVE METABOLIC PANEL
ALT: 28 U/L (ref 0–44)
AST: 30 U/L (ref 15–41)
Albumin: 4.2 g/dL (ref 3.5–5.0)
Alkaline Phosphatase: 62 U/L (ref 38–126)
Anion gap: 11 (ref 5–15)
BUN: 9 mg/dL (ref 6–20)
CO2: 27 mmol/L (ref 22–32)
Calcium: 9.2 mg/dL (ref 8.9–10.3)
Chloride: 99 mmol/L (ref 98–111)
Creatinine, Ser: 0.96 mg/dL (ref 0.61–1.24)
GFR, Estimated: >60 mL/min (ref >60)
Glucose, Bld: 127 mg/dL — ABNORMAL HIGH (ref 70–99)
Potassium: 3.9 mmol/L (ref 3.5–5.1)
Sodium: 137 mmol/L (ref 135–145)
Total Bilirubin: 0.6 mg/dL (ref 0.3–1.2)
Total Protein: 7.9 g/dL (ref 6.5–8.1)

## 2020-08-23 LAB — RESP PANEL BY RT-PCR (FLU A&B, COVID) ARPGX2
Influenza A by PCR: NEGATIVE
Influenza B by PCR: NEGATIVE
SARS Coronavirus 2 by RT PCR: POSITIVE — AB

## 2020-08-23 LAB — TROPONIN I (HIGH SENSITIVITY)
Troponin I (High Sensitivity): 9 ng/L (ref <18)
Troponin I (High Sensitivity): 8 ng/L (ref <18)

## 2020-08-23 LAB — LACTIC ACID, PLASMA: Lactic Acid, Venous: 1.5 mmol/L (ref 0.5–1.9)

## 2020-08-23 LAB — PROCALCITONIN: Procalcitonin: <0.1 ng/mL

## 2020-08-23 LAB — CK: Total CK: 103 U/L (ref 49–397)

## 2020-08-23 LAB — LIPASE, BLOOD: Lipase: 41 U/L (ref 11–51)

## 2020-08-23 MED ORDER — PAXLOVID 20 X 150 MG & 10 X 100MG PO TBPK: 3.0000 | ORAL_TABLET | Freq: Two times a day (BID) | ORAL | 0 refills | Status: AC

## 2020-08-23 MED ORDER — SODIUM CHLORIDE 0.9 % IV BOLUS
1000.0000 mL | Freq: Once | INTRAVENOUS | Status: AC
  Administered 2020-08-23: 1000 mL via INTRAVENOUS

## 2020-08-23 MED ORDER — KETOROLAC TROMETHAMINE 30 MG/ML IJ SOLN
15.0000 mg | Freq: Once | INTRAMUSCULAR | Status: AC
  Administered 2020-08-23: 15 mg via INTRAVENOUS
  Filled 2020-08-23: qty 1

## 2020-08-23 MED ORDER — ONDANSETRON 4 MG PO TBDP
4.0000 mg | ORAL_TABLET | Freq: Three times a day (TID) | ORAL | 0 refills | Status: AC | PRN
Start: 2020-08-23 — End: ?

## 2020-08-23 MED ORDER — AMLODIPINE BESYLATE 10 MG PO TABS: 10.0000 mg | ORAL_TABLET | Freq: Every day | ORAL | 0 refills | Status: AC

## 2020-08-23 MED ORDER — ONDANSETRON HCL 4 MG/2ML IJ SOLN
4.0000 mg | Freq: Once | INTRAMUSCULAR | Status: AC
  Administered 2020-08-23: 4 mg via INTRAVENOUS
  Filled 2020-08-23: qty 2

## 2020-08-23 NOTE — ED Provider Notes (Signed)
[facility] Emergency Department Provider Note   ____________________________________________   Event Date/Time   First MD Initiated Contact with Patient 08/23/20 [phone]     (approximate)  I have reviewed the triage vital signs and the nursing notes.   HISTORY  Chief Complaint Heat Exposure and Fever    HPI Ian Mcpherson is a 60 y.o. male who presents to the ED from home with a chief complaint of fever, chills, body aches, nausea.  Patient with a history of hypertension who states he has had significant sun exposure due to working for the past 2 days.  Last night he had a fever of 101 F at home and last took Tylenol prior to bedtime.  Reports fatigue, generalized achiness and nausea.  Denies cough, chest pain, shortness of breath, abdominal pain, vomiting or diarrhea.     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 No pertinent surgical history.  Prior to Admission medications   Medication Sig Start Date End Date Taking? Authorizing Provider  amlodipine-atorvastatin (CADUET) 10-10 MG tablet Take 1 tablet by mouth daily.    [provider]  aspirin EC 81 MG tablet Take 81 mg by mouth daily.    [provider]  fluticasone (FLONASE) 50 MCG/ACT nasal spray Place 1 spray into both nostrils daily.    [provider]  hydrochlorothiazide (MICROZIDE) 12.5 MG capsule Take 12.5 mg by mouth daily.    [provider]  meclizine (ANTIVERT) 25 MG tablet Take 1 tablet (25 mg total) by mouth 3 (three) times daily as needed for dizziness. Patient not taking: No sig reported 08/07/17   Tommie Sams, DO  omega-3 acid ethyl esters (LOVAZA) 1 g capsule Take by  mouth 2 (two) times daily.    [provider]  pantoprazole (PROTONIX) 20 MG tablet Take 20 mg by mouth 2 (two) times daily. 05/31/20   [provider]    Allergies Statins, Sulfa antibiotics, Amoxicillin, Losartan, and Simvastatin  Family History  Problem Relation Age of Onset   Hypertension Mother    Diabetes Mother    Heart disease Mother 72   Hypertension Father    Heart disease Father 70    Social History Social History   Tobacco Use   Smoking status: Former   Smokeless tobacco: Never  Building services engineer Use: Former  Substance Use Topics   Alcohol use: Yes    Comment: beer everday 5-6 beers   Drug use: No    Review of Systems  Constitutional: Positive for fever/chills and body aches. Eyes: No visual changes. ENT: No sore throat. Cardiovascular: Denies chest pain. Respiratory: Denies shortness of breath. Gastrointestinal: No abdominal pain.  Positive for nausea, no vomiting.  No diarrhea.  No constipation. Genitourinary: Negative for dysuria. Musculoskeletal: Negative for back pain. Skin: Negative for rash. Neurological: Negative for headaches, focal weakness or numbness.   ____________________________________________   PHYSICAL EXAM:  VITAL SIGNS: ED Triage Vitals [08/23/20 0540]  Enc Vitals Group     BP (!) 158/91     Pulse Rate (!) 117     Resp 19     Temp 99.4 F (37.4 C)     Temp  Source Oral     SpO2 100 %     Weight 200 lb (90.7 kg)     Height 6' (1.829 m)     Head Circumference      Peak Flow      Pain Score 6     Pain Loc      Pain Edu?      Excl. in GC?     Constitutional: Alert and oriented.  Tired appearing and in mild acute distress. Eyes: Conjunctivae are normal. PERRL. EOMI. Head: Atraumatic. Nose: No congestion/rhinnorhea. Mouth/Throat: Mucous membranes are mildly dry. Neck: No stridor.   Cardiovascular: Tachycardic rate, regular rhythm. Grossly normal heart sounds.  Good peripheral  circulation. Respiratory: Normal respiratory effort.  No retractions. Lungs CTAB. Gastrointestinal: Soft and nontender to light or deep palpation. No distention. No abdominal bruits. No CVA tenderness. Musculoskeletal: No lower extremity tenderness nor edema.  No joint effusions. Neurologic:  Normal speech and language. No gross focal neurologic deficits are appreciated. No gait instability. Skin:  Skin is warm, dry and intact. No rash noted. Psychiatric: Mood and affect are normal. Speech and behavior are normal.  ____________________________________________   LABS (all labs ordered are listed, but only abnormal results are displayed)  Labs Reviewed  CULTURE, BLOOD (ROUTINE X 2)  CULTURE, BLOOD (ROUTINE X 2)  URINE CULTURE  RESP PANEL BY RT-PCR (FLU A&B, COVID) ARPGX2  CBC WITH DIFFERENTIAL/PLATELET  COMPREHENSIVE METABOLIC PANEL  LIPASE, BLOOD  LACTIC ACID, PLASMA  LACTIC ACID, PLASMA  URINALYSIS, COMPLETE (UACMP) WITH MICROSCOPIC  CK  PROCALCITONIN  TROPONIN I (HIGH SENSITIVITY)   ____________________________________________  EKG  Pending  ____________________________________________  RADIOLOGY I, Ethyn Schetter J, personally viewed and evaluated these images (plain radiographs) as part of my medical decision making, as well as reviewing the written report by the radiologist.  ED MD interpretation:  None  Official radiology report(s): No results found.  ____________________________________________   PROCEDURES  Procedure(s) performed (including Critical Care):  .1-3 Lead EKG Interpretation  Date/Time: 08/23/2020 6:20 AM Performed by: Irean Hong, MD Authorized by: Irean Hong, MD     Interpretation: abnormal     ECG rate:  115   ECG rate assessment: tachycardic     Rhythm: sinus tachycardia     Ectopy: none     Conduction: normal   Comments:     Patient placed on cardiac monitor to evaluate for  arrhythmias   ____________________________________________   INITIAL IMPRESSION / ASSESSMENT AND PLAN / ED COURSE  As part of my medical decision making, I reviewed the following data within the electronic MEDICAL RECORD NUMBER Nursing notes reviewed and incorporated, Labs reviewed, EKG interpreted, Old chart reviewed, Radiograph reviewed, and Notes from prior ED visits     60 year old male presenting with fever, chills, myalgias and heat exposure.  Differential diagnosis includes but is not limited to heat related illness, viral process such as COVID-19, sepsis, CAP, UTI, rhabdomyolysis, etc.  Will obtain sepsis work-up, chest x-ray.  Initiate IV fluid resuscitation, IV Zofran for nausea, IV Toradol for body aches.  Will reassess.  Clinical Course as of 08/23/20 0707  Thu Aug 23, 2020  1660 Repeat oral temperature 100.1 F. [JS]  0704 Care transferred to Dr. Scotty Court at change of shift pending all laboratory, EKG and CXR results. [JS]    Clinical Course User Index [JS] Irean Hong, MD     ____________________________________________   FINAL CLINICAL IMPRESSION(S) / ED DIAGNOSES  Final diagnoses:  Myalgia     ED  Discharge Orders     None        Note:  This document was prepared using Dragon voice recognition software and may include unintentional dictation errors.    Irean Hong, MD 08/23/20 [phone]

## 2020-08-23 NOTE — Discharge Instructions (Addendum)
Stop taking Caduet (amlodipine-atorvastatin) for the next week. Take amlodipine 10mg  tablet once daily during this time to keep control of your blood pressure.   In one week, after the Paxlovid course is finished, you can resume taking your medications as before.

## 2020-08-23 NOTE — ED Provider Notes (Signed)
Procedures  Clinical Course as of 08/23/20 0953  Thu Aug 23, 2020  6948 Repeat oral temperature 100.1 F. [JS]  0704 Care transferred to Dr. Scotty Court at change of shift pending all laboratory, EKG and CXR results. [JS]    Clinical Course User Index [JS] Irean Hong, MD    ----------------------------------------- 9:53 AM on 08/23/2020 ----------------------------------------- COVID-positive.  The rest of the work-up is unremarkable.  Patient is feeling better after fluids Zofran and Toradol.  He is sitting upright, asymptomatic except for mild frontal headache.  Blood pressure is about 130/80.  Heart rate is 95.  He is nontoxic and stable for discharge.  Prescriptions for Paxlovid and Zofran sent to his pharmacy.  I reviewed his medication regimen with the patient.  He takes Caduet, counseled him to discontinue this and will prescribe a 1 week course of stand-alone amlodipine to control blood pressure and allow him to discontinue statin while on Paxlovid.  Final diagnoses:  Myalgia  COVID-19 virus infection  Dehydration        Sharman Cheek, MD 08/23/20 [phone]

## 2020-08-23 NOTE — ED Triage Notes (Signed)
Pt presents to ER c/o being overheated from working outside, but also has been feeling like he has a fever/chills/aches that started overnight tonight.  Pt denies being exposed to anyone sick.  Pt states he has fever of 101 at home but did not take tylenol.  Pt ambulatory to room.

## 2020-08-24 LAB — URINE CULTURE: Culture: NO GROWTH

## 2020-08-28 LAB — CULTURE, BLOOD (ROUTINE X 2)
Culture: NO GROWTH
Culture: NO GROWTH
Special Requests: ADEQUATE
Special Requests: ADEQUATE

## 2022-04-06 IMAGING — CR DG CHEST 2V
1 series · 2 of 2 positions shown · non-contrast
Comparison: None.

CLINICAL DATA: Chest pain

EXAM:
CHEST - 2 VIEW

[Series 1: dg chest 2 view · 0.14mm/px · 2 of 2 slices shown]
[im 1/2]
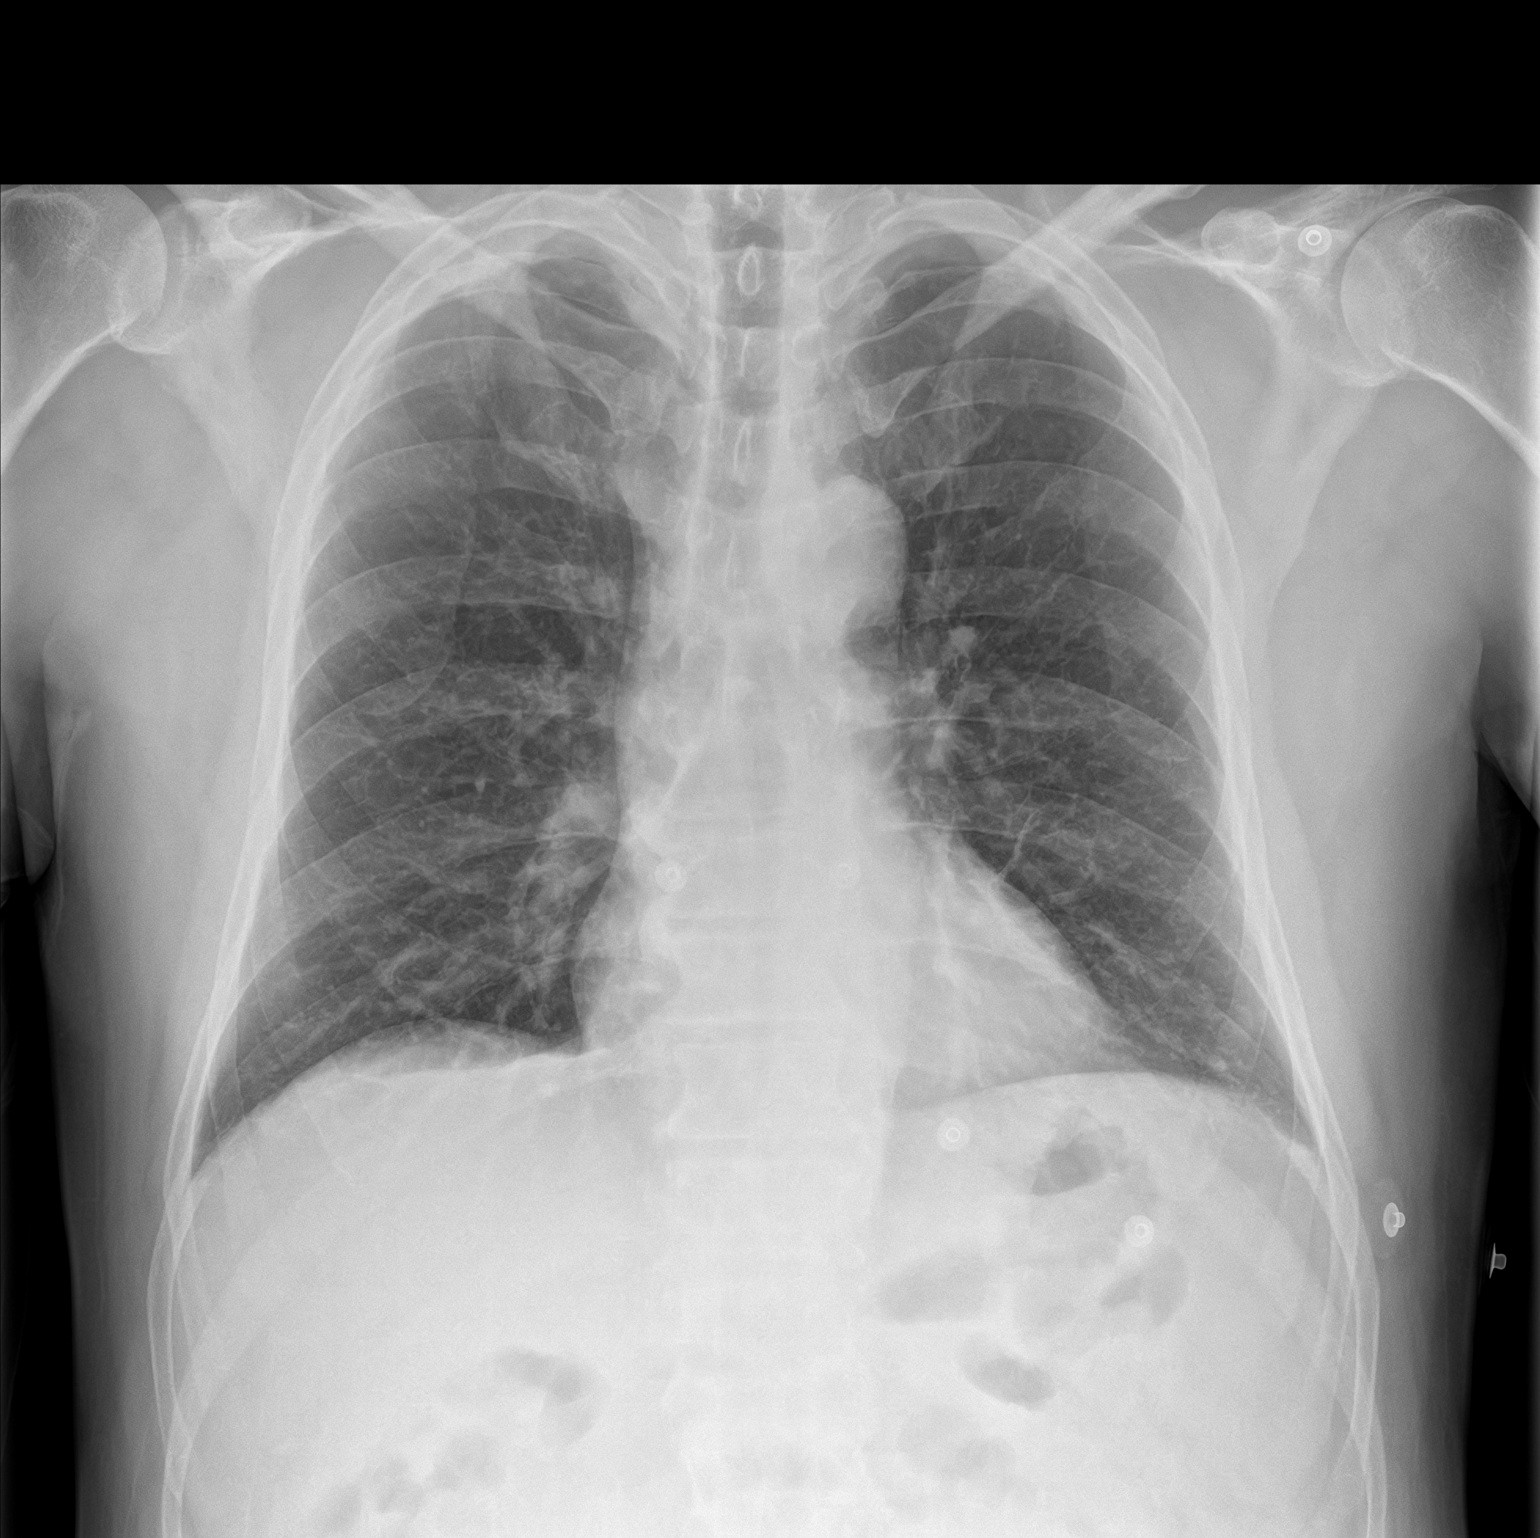
[im 2/2]
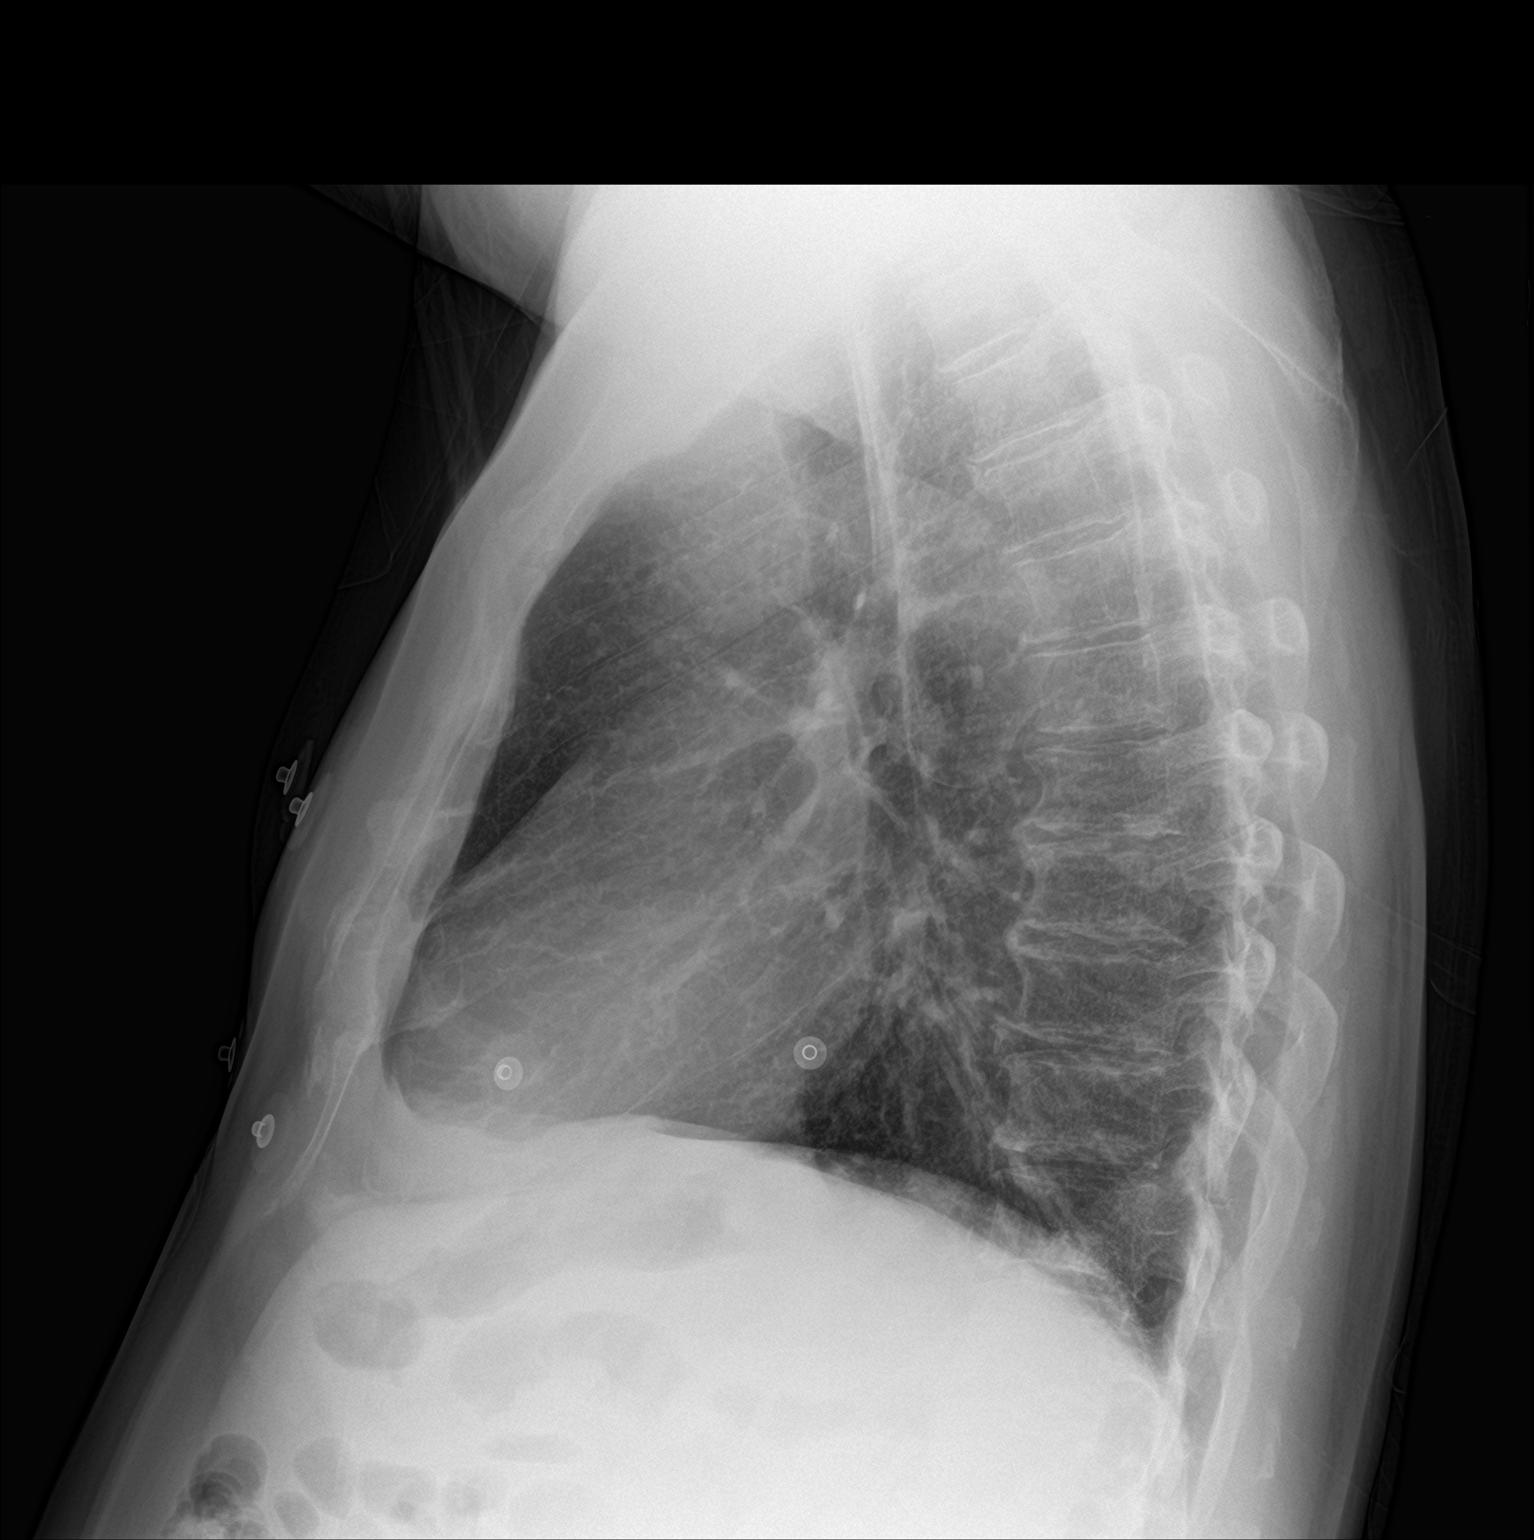

[2 of 2 positions shown; findings below may reference images not displayed]

FINDINGS: Mild apparent scarring medial left base. No edema or airspace
opacity. Heart size and pulmonary vascularity are normal. No
adenopathy. There are foci of degenerative change in the thoracic
spine.
IMPRESSION: Apparent scarring medial left base. Lungs otherwise clear. Heart
size normal. No adenopathy.
